# Patient Record
Sex: Female | Born: 1975 | Race: White | Hispanic: No | Marital: Married | Smoking: Former smoker
Health system: Southern US, Community
[De-identification: ages and names within clinical notes are randomized; demographics above are authoritative.]

## PROBLEM LIST (undated history)

## (undated) DIAGNOSIS — K219 Gastro-esophageal reflux disease without esophagitis: Secondary | ICD-10-CM

## (undated) HISTORY — PX: WISDOM TOOTH EXTRACTION: SHX21

## (undated) HISTORY — DX: Gastro-esophageal reflux disease without esophagitis: K21.9

## (undated) HISTORY — PX: TUBAL LIGATION: SHX77

---

## 1998-03-14 HISTORY — PX: DILATION AND CURETTAGE OF UTERUS: SHX78

## 1998-06-18 ENCOUNTER — Other Ambulatory Visit: Admission: RE | Admit: 1998-06-18 | Discharge: 1998-06-18 | Payer: Self-pay | Admitting: Gynecology

## 1999-04-19 ENCOUNTER — Other Ambulatory Visit: Admission: RE | Admit: 1999-04-19 | Discharge: 1999-04-19 | Payer: Self-pay | Admitting: Obstetrics & Gynecology

## 1999-04-21 ENCOUNTER — Ambulatory Visit (HOSPITAL_COMMUNITY): Admission: RE | Admit: 1999-04-21 | Discharge: 1999-04-21 | Payer: Self-pay | Admitting: Obstetrics & Gynecology

## 2004-02-13 ENCOUNTER — Ambulatory Visit: Payer: Self-pay | Admitting: Family Medicine

## 2004-11-08 ENCOUNTER — Ambulatory Visit: Payer: Self-pay | Admitting: Family Medicine

## 2007-03-05 ENCOUNTER — Ambulatory Visit: Payer: Self-pay | Admitting: Unknown Physician Specialty

## 2007-03-13 ENCOUNTER — Ambulatory Visit: Payer: Self-pay | Admitting: Unknown Physician Specialty

## 2013-03-06 ENCOUNTER — Ambulatory Visit: Payer: Self-pay | Admitting: Family Medicine

## 2013-09-26 ENCOUNTER — Ambulatory Visit: Payer: Self-pay | Admitting: Gastroenterology

## 2013-12-20 ENCOUNTER — Ambulatory Visit: Payer: Self-pay | Admitting: Urgent Care

## 2014-01-09 ENCOUNTER — Ambulatory Visit: Payer: Self-pay | Admitting: Gastroenterology

## 2014-12-18 ENCOUNTER — Ambulatory Visit (INDEPENDENT_AMBULATORY_CARE_PROVIDER_SITE_OTHER): Payer: 59 | Admitting: Family Medicine

## 2014-12-18 ENCOUNTER — Encounter: Payer: Self-pay | Admitting: Family Medicine

## 2014-12-18 ENCOUNTER — Encounter (INDEPENDENT_AMBULATORY_CARE_PROVIDER_SITE_OTHER): Payer: Self-pay

## 2014-12-18 VITALS — BP 116/72 | HR 84 | Temp 98.1°F | Ht 66.0 in | Wt 281.5 lb

## 2014-12-18 DIAGNOSIS — H6983 Other specified disorders of Eustachian tube, bilateral: Secondary | ICD-10-CM

## 2014-12-18 DIAGNOSIS — K219 Gastro-esophageal reflux disease without esophagitis: Secondary | ICD-10-CM | POA: Diagnosis not present

## 2014-12-18 DIAGNOSIS — Z01419 Encounter for gynecological examination (general) (routine) without abnormal findings: Secondary | ICD-10-CM | POA: Insufficient documentation

## 2014-12-18 DIAGNOSIS — H6980 Other specified disorders of Eustachian tube, unspecified ear: Secondary | ICD-10-CM | POA: Insufficient documentation

## 2014-12-18 NOTE — Patient Instructions (Signed)
Great to meet you. We will call you with your results and you can view them online.  Try OTC zyrtec or claritin daily for 2 weeks. Keep me updated.

## 2014-12-18 NOTE — Assessment & Plan Note (Signed)
Pap smear done today

## 2014-12-18 NOTE — Progress Notes (Signed)
Pre visit review using our clinic review tool, if applicable. No additional management support is needed unless otherwise documented below in the visit note. 

## 2014-12-18 NOTE — Addendum Note (Signed)
Addended by: Dianne Dun on: 12/18/2014 11:47 AM   Modules accepted: Kipp Brood

## 2014-12-18 NOTE — Progress Notes (Signed)
Subjective:   Patient ID: Tammie Murphy, female    DOB: 1975-07-22, 39 y.o.   MRN: [id number]  Tammie Murphy is a pleasant 39 y.o. year old female who presents to clinic today with Establish Care and Ear Pain  on 12/18/2014  HPI:   G3P1 here to establish care and for annual GYN exam.  Has had some bilateral ear pressure and itchy eyes on and off for weeks. No cough, fevers, chills or SOB. Has not taken anything for it. Has never been diagnosed with seasonal allergies.  Mammogram 03/13/14.  Had a pap smear in 02/2014 but was told she needed it done again- "too much gel on the slide."  No h/o abnormal pap smears.  Sexually active with husband only.  Remote h/o BTL.  No current outpatient prescriptions on file prior to visit.   No current facility-administered medications on file prior to visit.    Allergies  Allergen Reactions  . Flonase [Fluticasone Propionate] Anaphylaxis    Past Medical History  Diagnosis Date  . GERD (gastroesophageal reflux disease)     Past Surgical History  Procedure Laterality Date  . Tubal ligation    . Wisdom tooth extraction      Family History  Problem Relation Age of Onset  . Hypertension Mother   . Alcohol abuse Father   . AAA (abdominal aortic aneurysm) Father   . Hypertension Father   . AAA (abdominal aortic aneurysm) Maternal Grandmother   . Hypertension Maternal Grandfather   . Heart attack Maternal Grandfather   . Heart disease Maternal Grandfather   . AAA (abdominal aortic aneurysm) Paternal Grandmother   . Alzheimer's disease Paternal Grandmother   . Prostate cancer Paternal Grandfather   . Heart disease Paternal Grandfather     Social History   Social History  . Marital Status: Married    Spouse Name: N/A  . Number of Children: N/A  . Years of Education: N/A   Occupational History  . Not on file.   Social History Main Topics  . Smoking status: Former Games developer  . Smokeless tobacco: Never Used  . Alcohol  Use: No  . Drug Use: No  . Sexual Activity: Yes   Other Topics Concern  . Not on file   Social History Narrative  . No narrative on file   The PMH, PSH, Social History, Family History, Medications, and allergies have been reviewed in [facility], and have been updated if relevant.     Review of Systems  Constitutional: Negative.   HENT: Positive for congestion and rhinorrhea. Negative for sinus pressure.   Respiratory: Negative.   Cardiovascular: Negative.   Gastrointestinal: Negative.   Endocrine: Negative.   Genitourinary: Negative.   Skin: Negative.   Allergic/Immunologic: Negative.   Neurological: Negative.   Hematological: Negative.   Psychiatric/Behavioral: Negative.   All other systems reviewed and are negative.      Objective:    BP 116/72 mmHg  Pulse 84  Temp(Src) 98.1 F (36.7 C) (Oral)  Ht  (1.676 m)  Wt 281 lb 8 oz (127.688 kg)  BMI 45.46 kg/m2  SpO2 98%  LMP 12/08/2014   Physical Exam    General:  Well-developed,well-nourished,in no acute distress; alert,appropriate and cooperative throughout examination Head:  normocephalic and atraumatic.   Eyes:  vision grossly intact, pupils equal, pupils round, and pupils reactive to light.   Ears:  R ear normal and L ear normal.  Tms retracted bilaterally  Nose:  no external deformity.  Mouth:  good dentition.   Neck:  No deformities, masses, or tenderness noted. Breasts:  No mass, nodules, thickening, tenderness, bulging, retraction, inflamation, nipple discharge or skin changes noted.   Lungs:  Normal respiratory effort, chest expands symmetrically. Lungs are clear to auscultation, no crackles or wheezes. Heart:  Normal rate and regular rhythm. S1 and S2 normal without gallop, murmur, click, rub or other extra sounds. Abdomen:  Bowel sounds positive,abdomen soft and non-tender without masses, organomegaly or hernias noted. Rectal:  no external abnormalities.   Genitalia:  Pelvic Exam:        External:  normal female genitalia without lesions or masses        Vagina: normal without lesions or masses        Cervix: normal without lesions or masses        Adnexa: normal bimanual exam without masses or fullness        Uterus: normal by palpation        Pap smear: performed Msk:  No deformity or scoliosis noted of thoracic or lumbar spine.   Extremities:  No clubbing, cyanosis, edema, or deformity noted with normal full range of motion of all joints.   Neurologic:  alert & oriented X3 and gait normal.   Skin:  Intact without suspicious lesions or rashes Cervical Nodes:  No lymphadenopathy noted Axillary Nodes:  No palpable lymphadenopathy Psych:  Cognition and judgment appear intact. Alert and cooperative with normal attention span and concentration. No apparent delusions, illusions, hallucinations      Assessment & Plan:   Gastroesophageal reflux disease, esophagitis presence not specified No Follow-up on file.

## 2014-12-18 NOTE — Assessment & Plan Note (Signed)
Well controlled with prn PPI. No changes made today.

## 2014-12-18 NOTE — Assessment & Plan Note (Signed)
Advised supportive care with OTC oral anithistamines and steroid nasal sprays. No signs or symptoms of sinus infection at this point. Call or return to clinic prn if these symptoms worsen or fail to improve as anticipated. The patient indicates understanding of these issues and agrees with the plan.

## 2014-12-19 LAB — CBC WITH DIFFERENTIAL/PLATELET
BASOS ABS: 0 10*3/uL (ref 0.0–0.2)
Basos: 1 %
EOS (ABSOLUTE): 0.1 10*3/uL (ref 0.0–0.4)
Eos: 1 %
Hematocrit: 33.7 % — ABNORMAL LOW (ref 34.0–46.6)
Hemoglobin: 10.8 g/dL — ABNORMAL LOW (ref 11.1–15.9)
Immature Grans (Abs): 0 10*3/uL (ref 0.0–0.1)
Immature Granulocytes: 0 %
LYMPHS ABS: 2.5 10*3/uL (ref 0.7–3.1)
Lymphs: 30 %
MCH: 25.5 pg — ABNORMAL LOW (ref 26.6–33.0)
MCHC: 32 g/dL (ref 31.5–35.7)
MCV: 80 fL (ref 79–97)
MONOS ABS: 0.5 10*3/uL (ref 0.1–0.9)
Monocytes: 6 %
NEUTROS PCT: 62 %
Neutrophils Absolute: 5.3 10*3/uL (ref 1.4–7.0)
PLATELETS: 403 10*3/uL — AB (ref 150–379)
RBC: 4.23 x10E6/uL (ref 3.77–5.28)
RDW: 14.8 % (ref 12.3–15.4)
WBC: 8.4 10*3/uL (ref 3.4–10.8)

## 2014-12-19 LAB — COMPREHENSIVE METABOLIC PANEL
ALK PHOS: 98 IU/L (ref 39–117)
ALT: 15 IU/L (ref 0–32)
AST: 11 IU/L (ref 0–40)
Albumin/Globulin Ratio: 1.6 (ref 1.1–2.5)
Albumin: 4 g/dL (ref 3.5–5.5)
BUN/Creatinine Ratio: 11 (ref 9–23)
BUN: 9 mg/dL (ref 6–24)
Bilirubin Total: 0.2 mg/dL (ref 0.0–1.2)
CO2: 24 mmol/L (ref 18–29)
CREATININE: 0.83 mg/dL (ref 0.57–1.00)
Calcium: 8.8 mg/dL (ref 8.7–10.2)
Chloride: 100 mmol/L (ref 97–108)
GFR calc Af Amer: 102 mL/min/{1.73_m2} (ref >59)
GFR calc non Af Amer: 88 mL/min/{1.73_m2} (ref >59)
GLUCOSE: 87 mg/dL (ref 65–99)
Globulin, Total: 2.5 g/dL (ref 1.5–4.5)
Potassium: 4.6 mmol/L (ref 3.5–5.2)
SODIUM: 139 mmol/L (ref 134–144)
Total Protein: 6.5 g/dL (ref 6.0–8.5)

## 2014-12-19 LAB — LIPID PANEL
CHOLESTEROL TOTAL: 203 mg/dL — AB (ref 100–199)
Chol/HDL Ratio: 4.3 ratio units (ref 0.0–4.4)
HDL: 47 mg/dL (ref >39)
LDL CALC: 135 mg/dL — AB (ref 0–99)
TRIGLYCERIDES: 106 mg/dL (ref 0–149)
VLDL CHOLESTEROL CAL: 21 mg/dL (ref 5–40)

## 2014-12-19 LAB — TSH: TSH: 1.04 u[IU]/mL (ref 0.450–4.500)

## 2014-12-22 ENCOUNTER — Encounter: Payer: Self-pay | Admitting: Family Medicine

## 2014-12-23 LAB — PAP LB (LIQUID-BASED): PAP SMEAR COMMENT: 0

## 2014-12-24 ENCOUNTER — Encounter: Payer: Self-pay | Admitting: *Deleted

## 2015-01-28 ENCOUNTER — Ambulatory Visit (INDEPENDENT_AMBULATORY_CARE_PROVIDER_SITE_OTHER): Payer: 59 | Admitting: Family Medicine

## 2015-01-28 ENCOUNTER — Encounter: Payer: Self-pay | Admitting: Family Medicine

## 2015-01-28 VITALS — BP 126/82 | HR 87 | Temp 98.0°F | Wt 280.0 lb

## 2015-01-28 DIAGNOSIS — K219 Gastro-esophageal reflux disease without esophagitis: Secondary | ICD-10-CM | POA: Diagnosis not present

## 2015-01-28 DIAGNOSIS — F411 Generalized anxiety disorder: Secondary | ICD-10-CM

## 2015-01-28 MED ORDER — ALPRAZOLAM 0.25 MG PO TABS: 0.2500 mg | ORAL_TABLET | Freq: Two times a day (BID) | ORAL | Status: DC | PRN

## 2015-01-28 NOTE — Patient Instructions (Signed)
  Try over the counter gas-x (four times a day when necessary) or beano for bloating.    Trial of Align daily as a probiotic.  Please call me in 2 weeks- if no improvement, we will change your protonix like we discussed-  Option 1- a trial of prilosec or nexium instead of protonix Option 2- a trial of zantac or pepcid OTC with or without one of the above options.

## 2015-01-28 NOTE — Assessment & Plan Note (Signed)
Discussed panic disorder with pt. She is not willing to take SSRI daily. Rx given for xanax to take for severe panic attacks only.  Discussed sedation and addiction potential.

## 2015-01-28 NOTE — Assessment & Plan Note (Signed)
>  25 minutes spent in face to face time with patient, >50% spent in counselling or coordination of care discussing GERD and anxiety. I suggested that we add a probiotic and gas x or beno as needed for gas pain. Follow up in 2 weeks, then consider d/c protonix and transitioning to either a weaker PPI and or an H2 blocker. The patient indicates understanding of these issues and agrees with the plan.

## 2015-01-28 NOTE — Progress Notes (Signed)
Pre visit review using our clinic review tool, if applicable. No additional management support is needed unless otherwise documented below in the visit note. 

## 2015-01-28 NOTE — Progress Notes (Signed)
Subjective:   Patient ID: Tammie Murphy, female    DOB: [birth year], 39 y.o.   MRN: [id number]  Tammie Murphy is a pleasant 39 y.o. year old female who presents to clinic today with Follow-up; Gas; and Anxiety  on 01/28/2015  HPI:  Established care with me last month.  GERD- has been on protonix for almost a year after she had an endoscopy done.  It has been working well for symptoms of GERD. Over past few weeks, increased belching and bloating. No longer drinking sodas and spicy foods. Has not tried gas x.  No black or bloody stool. No nausea or vomiting.  Anxiety- long term issue.  Used to have daily panic attacks, now only a few times a month.  Usually when she gets worried about something and gets "carried away." No depression.  Tried lexapro years ago and made her feel like she "would crawl out of her skin."  Current Outpatient Prescriptions on File Prior to Visit  Medication Sig Dispense Refill  . pantoprazole (PROTONIX) 40 MG tablet Take 40 mg by mouth daily.     No current facility-administered medications on file prior to visit.    Allergies  Allergen Reactions  . Flonase [Fluticasone Propionate] Anaphylaxis    Past Medical History  Diagnosis Date  . GERD (gastroesophageal reflux disease)     Past Surgical History  Procedure Laterality Date  . Tubal ligation    . Wisdom tooth extraction      Family History  Problem Relation Age of Onset  . Hypertension Mother   . Alcohol abuse Father   . AAA (abdominal aortic aneurysm) Father   . Hypertension Father   . AAA (abdominal aortic aneurysm) Maternal Grandmother   . Hypertension Maternal Grandfather   . Heart attack Maternal Grandfather   . Heart disease Maternal Grandfather   . AAA (abdominal aortic aneurysm) Paternal Grandmother   . Alzheimer's disease Paternal Grandmother   . Prostate cancer Paternal Grandfather   . Heart disease Paternal Grandfather     Social History   Social History  .  Marital Status: Married    Spouse Name: N/A  . Number of Children: N/A  . Years of Education: N/A   Occupational History  . Not on file.   Social History Main Topics  . Smoking status: Former Games developer  . Smokeless tobacco: Never Used  . Alcohol Use: No  . Drug Use: No  . Sexual Activity: Yes   Other Topics Concern  . Not on file   Social History Narrative   The PMH, PSH, Social History, Family History, Medications, and allergies have been reviewed in [facility], and have been updated if relevant.   Review of Systems  Gastrointestinal: Positive for abdominal distention. Negative for nausea, vomiting, abdominal pain, diarrhea, constipation, blood in stool, anal bleeding and rectal pain.  Psychiatric/Behavioral: Negative for hallucinations, behavioral problems, confusion, sleep disturbance, self-injury, dysphoric mood and agitation. The patient is nervous/anxious. The patient is not hyperactive.   All other systems reviewed and are negative.      Objective:    BP 126/82 mmHg  Pulse 87  Temp(Src) 98 F (36.7 C) (Oral)  Wt 280 lb (127.007 kg)  SpO2 98%  LMP 01/01/2015 (Approximate)   Physical Exam  Constitutional: She appears well-developed and well-nourished. No distress.  HENT:  Head: Normocephalic.  Eyes: Conjunctivae are normal.  Cardiovascular: Normal rate.   Pulmonary/Chest: Effort normal.  Abdominal: Soft. She exhibits no distension.  Neurological: She is alert. No  cranial nerve deficit.  Skin: Skin is warm and dry.  Psychiatric: She has a normal mood and affect. Her behavior is normal. Judgment and thought content normal.  Nursing note and vitals reviewed.         Assessment & Plan:   Gastroesophageal reflux disease, esophagitis presence not specified  Generalized anxiety disorder No Follow-up on file.

## 2015-01-30 ENCOUNTER — Other Ambulatory Visit: Payer: Self-pay | Admitting: Gastroenterology

## 2015-03-06 ENCOUNTER — Ambulatory Visit (INDEPENDENT_AMBULATORY_CARE_PROVIDER_SITE_OTHER): Payer: 59 | Admitting: Family Medicine

## 2015-03-06 ENCOUNTER — Encounter: Payer: Self-pay | Admitting: Family Medicine

## 2015-03-06 VITALS — BP 114/72 | HR 80 | Temp 98.1°F | Wt 277.0 lb

## 2015-03-06 DIAGNOSIS — R1032 Left lower quadrant pain: Secondary | ICD-10-CM

## 2015-03-06 DIAGNOSIS — R1031 Right lower quadrant pain: Secondary | ICD-10-CM | POA: Diagnosis not present

## 2015-03-06 LAB — POCT URINALYSIS DIPSTICK
Bilirubin, UA: NEGATIVE
Blood, UA: NEGATIVE
Glucose, UA: NEGATIVE
Ketones, UA: NEGATIVE
LEUKOCYTES UA: NEGATIVE
NITRITE UA: NEGATIVE
PH UA: 6.5
Protein, UA: NEGATIVE
Spec Grav, UA: 1.015
Urobilinogen, UA: NEGATIVE

## 2015-03-06 LAB — POCT URINE PREGNANCY: PREG TEST UR: NEGATIVE

## 2015-03-06 NOTE — Progress Notes (Signed)
   Subjective:    Patient ID: Tammie Murphy, female    DOB: 23-Oct-1975, 39 y.o.   MRN: [id number]  HPI 39 year old female pt of Dr. Elmer SowAron's  presents with new onset pain in left lower quadrant, intermittently. Began after menses started ( heavy flow this time,usually very crampy as well with it). Now in last few days it has happened bilaterally. Pain on 4/10 pain scale. Occ pain shooting in tops of legs. Worse with sitting.  No dysuria. Slight mild urinary pressure.  NO D/C/ mild N/V. Nml BM today.  Also associated with low back pain bilaterally. She does have history of low back and hip pain.   She is sexually active, no new partners.  Social History /Family History/Past Medical History reviewed and updated if needed. S/P BTL.  Review of Systems  Constitutional: Negative for fever and fatigue.  HENT: Negative for ear pain.   Eyes: Negative for pain.  Respiratory: Negative for chest tightness and shortness of breath.   Cardiovascular: Negative for chest pain, palpitations and leg swelling.  Gastrointestinal: Negative for abdominal pain.  Genitourinary: Negative for dysuria.          Objective:   Physical Exam  Constitutional: Vital signs are normal. She appears well-developed and well-nourished. She is cooperative.  Non-toxic appearance. She does not appear ill. No distress.  Morbidly obese  HENT:  Head: Normocephalic.  Right Ear: Hearing, tympanic membrane, external ear and ear canal normal. Tympanic membrane is not erythematous, not retracted and not bulging.  Left Ear: Hearing, tympanic membrane, external ear and ear canal normal. Tympanic membrane is not erythematous, not retracted and not bulging.  Nose: No mucosal edema or rhinorrhea. Right sinus exhibits no maxillary sinus tenderness and no frontal sinus tenderness. Left sinus exhibits no maxillary sinus tenderness and no frontal sinus tenderness.  Mouth/Throat: Uvula is midline, oropharynx is clear and moist and  mucous membranes are normal.  Eyes: Conjunctivae, EOM and lids are normal. Pupils are equal, round, and reactive to light. Lids are everted and swept, no foreign bodies found.  Neck: Trachea normal and normal range of motion. Neck supple. Carotid bruit is not present. No thyroid mass and no thyromegaly present.  Cardiovascular: Normal rate, regular rhythm, S1 normal, S2 normal, normal heart sounds, intact distal pulses and normal pulses.  Exam reveals no gallop and no friction rub.   No murmur heard. Pulmonary/Chest: Effort normal and breath sounds normal. No tachypnea. No respiratory distress. She has no decreased breath sounds. She has no wheezes. She has no rhonchi. She has no rales.  Abdominal: Soft. Normal appearance and bowel sounds are normal. There is no hepatosplenomegaly. There is tenderness in the left lower quadrant. There is no rigidity, no guarding and no CVA tenderness.  Mild ttp  Neurological: She is alert.  Skin: Skin is warm, dry and intact. No rash noted.  Psychiatric: Her speech is normal and behavior is normal. Judgment and thought content normal. Her mood appears not anxious. Cognition and memory are normal. She does not exhibit a depressed mood.          Assessment & Plan:

## 2015-03-06 NOTE — Progress Notes (Signed)
Pre visit review using our clinic review tool, if applicable. No additional management support is needed unless otherwise documented below in the visit note. 

## 2015-03-06 NOTE — Patient Instructions (Signed)
Stop at front desk to set up ultrasound pelvic to eval ovaries etc.  Can use ibuprofen 800 mg every 8 hours for pain OTC.  If severe pain  go to ER.

## 2015-03-06 NOTE — Assessment & Plan Note (Signed)
Neg UA, neg Upreg. Will send for eval of ovaries with transvag and pelvic US.  NSAIDS for pain as needed.

## 2015-03-11 ENCOUNTER — Ambulatory Visit: Payer: 59

## 2015-03-11 ENCOUNTER — Ambulatory Visit
Admission: RE | Admit: 2015-03-11 | Discharge: 2015-03-11 | Disposition: A | Discharge status: Home / Self Care | Payer: 59 | Source: Ambulatory Visit | Attending: Family Medicine | Admitting: Family Medicine

## 2015-03-11 DIAGNOSIS — R1032 Left lower quadrant pain: Secondary | ICD-10-CM | POA: Insufficient documentation

## 2015-03-11 DIAGNOSIS — R1031 Right lower quadrant pain: Secondary | ICD-10-CM | POA: Diagnosis present

## 2015-03-11 DIAGNOSIS — N858 Other specified noninflammatory disorders of uterus: Secondary | ICD-10-CM | POA: Insufficient documentation

## 2015-06-01 ENCOUNTER — Ambulatory Visit (INDEPENDENT_AMBULATORY_CARE_PROVIDER_SITE_OTHER): Payer: 59 | Admitting: Family Medicine

## 2015-06-01 ENCOUNTER — Encounter: Payer: Self-pay | Admitting: Family Medicine

## 2015-06-01 VITALS — BP 126/84 | HR 79 | Temp 97.9°F | Wt 282.8 lb

## 2015-06-01 DIAGNOSIS — M5489 Other dorsalgia: Secondary | ICD-10-CM | POA: Diagnosis not present

## 2015-06-01 DIAGNOSIS — R0789 Other chest pain: Secondary | ICD-10-CM | POA: Diagnosis not present

## 2015-06-01 DIAGNOSIS — M549 Dorsalgia, unspecified: Secondary | ICD-10-CM | POA: Insufficient documentation

## 2015-06-01 DIAGNOSIS — R079 Chest pain, unspecified: Secondary | ICD-10-CM | POA: Insufficient documentation

## 2015-06-01 MED ORDER — CYCLOBENZAPRINE HCL 5 MG PO TABS: 5.0000 mg | ORAL_TABLET | Freq: Three times a day (TID) | ORAL | Status: DC | PRN

## 2015-06-01 NOTE — Progress Notes (Signed)
Pre visit review using our clinic review tool, if applicable. No additional management support is needed unless otherwise documented below in the visit note. 

## 2015-06-01 NOTE — Assessment & Plan Note (Signed)
Consistent with spasm. eRx sent for flexeril prn. Discussed PT if symptoms persist. The patient indicates understanding of these issues and agrees with the plan.

## 2015-06-01 NOTE — Progress Notes (Signed)
Subjective:   Patient ID: Tammie Murphy, female    DOB: 1975/08/18, 40 y.o.   MRN: [id number]  Tammie Murphy is a pleasant 40 y.o. year old female who presents to clinic today with Back Pain and Chest Pain  on 06/01/2015  HPI:  Intermittent left shoulder pain, sometimes radiates to her back.  Previous PCP would give her valium for a few days and symptoms would resolve.  Now radiates a little to her left chest which makes her feel very anxious.  Denies SOB.  This typically occurs at rest or with movement of her left shoulder but not with exertion.  Does have FH of CAD.  No recent injury.  Trying to cut back on NSAIDs due to GI side effects.   Current Outpatient Prescriptions on File Prior to Visit  Medication Sig Dispense Refill  . ALPRAZolam (XANAX) 0.25 MG tablet Take 1 tablet (0.25 mg total) by mouth 2 (two) times daily as needed for anxiety. 30 tablet 0  . pantoprazole (PROTONIX) 40 MG tablet Take 1 tablet by mouth  daily 90 tablet 3   No current facility-administered medications on file prior to visit.    Allergies  Allergen Reactions  . Flonase [Fluticasone Propionate] Anaphylaxis    Past Medical History  Diagnosis Date  . GERD (gastroesophageal reflux disease)     Past Surgical History  Procedure Laterality Date  . Tubal ligation    . Wisdom tooth extraction      Family History  Problem Relation Age of Onset  . Hypertension Mother   . Alcohol abuse Father   . AAA (abdominal aortic aneurysm) Father   . Hypertension Father   . AAA (abdominal aortic aneurysm) Maternal Grandmother   . Hypertension Maternal Grandfather   . Heart attack Maternal Grandfather   . Heart disease Maternal Grandfather   . AAA (abdominal aortic aneurysm) Paternal Grandmother   . Alzheimer's disease Paternal Grandmother   . Prostate cancer Paternal Grandfather   . Heart disease Paternal Grandfather     Social History   Social History  . Marital Status: Married   Spouse Name: N/A  . Number of Children: N/A  . Years of Education: N/A   Occupational History  . Not on file.   Social History Main Topics  . Smoking status: Former Games developer  . Smokeless tobacco: Never Used  . Alcohol Use: No  . Drug Use: No  . Sexual Activity: Yes   Other Topics Concern  . Not on file   Social History Narrative   The PMH, PSH, Social History, Family History, Medications, and allergies have been reviewed in [facility], and have been updated if relevant.   Review of Systems  Constitutional: Negative.   HENT: Negative.   Respiratory: Positive for chest tightness. Negative for apnea, cough, choking, shortness of breath, wheezing and stridor.   Cardiovascular: Positive for chest pain and palpitations. Negative for leg swelling.  Gastrointestinal: Negative.   Genitourinary: Negative.   Musculoskeletal:       + left shoulder pain that radiates to left thoracic area  Skin: Negative.   Neurological: Negative.   Psychiatric/Behavioral: The patient is nervous/anxious.   All other systems reviewed and are negative.      Objective:    BP 126/84 mmHg  Pulse 79  Temp(Src) 97.9 F (36.6 C) (Oral)  Wt 282 lb 12 oz (128.255 kg)  SpO2 99%  LMP 05/18/2015 (Approximate)   Physical Exam  Constitutional: She is oriented to person, place, and  time. She appears well-developed and well-nourished. No distress.  HENT:  Head: Normocephalic.  Eyes: Conjunctivae are normal.  Cardiovascular: Normal rate, regular rhythm and normal heart sounds.   Pulmonary/Chest: Effort normal.  Musculoskeletal:       Left shoulder: She exhibits tenderness, pain and spasm. She exhibits normal range of motion, no bony tenderness, no swelling, no effusion, no crepitus, no deformity, no laceration, normal pulse and normal strength.  Neurological: She is alert and oriented to person, place, and time. No cranial nerve deficit.  Skin: Skin is warm and dry. She is not diaphoretic.  Psychiatric: She has a  normal mood and affect. Her behavior is normal. Judgment and thought content normal.  Nursing note and vitals reviewed.         Assessment & Plan:   Other chest pain  Other back pain No Follow-up on file.

## 2015-06-01 NOTE — Assessment & Plan Note (Signed)
Intermittent, unlikely cardiac. ? Related to pinch nerve from shoulder and possibly anxiety. EKG NSR. If symptoms worsen, refer to cardiology for work up/ ? Stress test/holter monitor.

## 2015-12-03 ENCOUNTER — Encounter: Payer: Self-pay | Admitting: Family Medicine

## 2015-12-03 ENCOUNTER — Ambulatory Visit (INDEPENDENT_AMBULATORY_CARE_PROVIDER_SITE_OTHER): Payer: 59 | Admitting: Family Medicine

## 2015-12-03 VITALS — BP 132/92 | HR 81 | Temp 98.4°F | Wt 283.8 lb

## 2015-12-03 DIAGNOSIS — J029 Acute pharyngitis, unspecified: Secondary | ICD-10-CM

## 2015-12-03 LAB — POCT RAPID STREP A (OFFICE): Rapid Strep A Screen: POSITIVE — AB

## 2015-12-03 MED ORDER — AMOXICILLIN 500 MG PO CAPS: 500.0000 mg | ORAL_CAPSULE | Freq: Two times a day (BID) | ORAL | 0 refills | Status: DC

## 2015-12-03 NOTE — Patient Instructions (Signed)

## 2015-12-03 NOTE — Progress Notes (Signed)
Pre visit review using our clinic review tool, if applicable. No additional management support is needed unless otherwise documented below in the visit note. 

## 2015-12-03 NOTE — Progress Notes (Signed)
SUBJECTIVE: 40 y.o. female with sore throat, myalgias, swollen glands, headache and fever for 2 days. No history of rheumatic fever. Other symptoms: congestion.  Current Outpatient Prescriptions on File Prior to Visit  Medication Sig Dispense Refill  . ALPRAZolam (XANAX) 0.25 MG tablet Take 1 tablet (0.25 mg total) by mouth 2 (two) times daily as needed for anxiety. 30 tablet 0  . cyclobenzaprine (FLEXERIL) 5 MG tablet Take 1 tablet (5 mg total) by mouth 3 (three) times daily as needed for muscle spasms. 30 tablet 1  . pantoprazole (PROTONIX) 40 MG tablet Take 1 tablet by mouth  daily 90 tablet 3  . polyethylene glycol (MIRALAX / GLYCOLAX) packet Take 17 g by mouth daily.     No current facility-administered medications on file prior to visit.     Allergies  Allergen Reactions  . Flonase [Fluticasone Propionate] Anaphylaxis    Past Medical History:  Diagnosis Date  . GERD (gastroesophageal reflux disease)     Past Surgical History:  Procedure Laterality Date  . TUBAL LIGATION    . WISDOM TOOTH EXTRACTION      Family History  Problem Relation Age of Onset  . Hypertension Mother   . Alcohol abuse Father   . AAA (abdominal aortic aneurysm) Father   . Hypertension Father   . AAA (abdominal aortic aneurysm) Maternal Grandmother   . Hypertension Maternal Grandfather   . Heart attack Maternal Grandfather   . Heart disease Maternal Grandfather   . AAA (abdominal aortic aneurysm) Paternal Grandmother   . Alzheimer's disease Paternal Grandmother   . Prostate cancer Paternal Grandfather   . Heart disease Paternal Grandfather     Social History   Social History  . Marital status: Married    Spouse name: N/A  . Number of children: N/A  . Years of education: N/A   Occupational History  . Not on file.   Social History Main Topics  . Smoking status: Former Games developermoker  . Smokeless tobacco: Never Used  . Alcohol use No  . Drug use: No  . Sexual activity: Yes   Other Topics  Concern  . Not on file   Social History Narrative  . No narrative on file   The PMH, PSH, Social History, Family History, Medications, and [facility], and have been updated if relevant.  OBJECTIVE:  BP (!) 132/92   Pulse 81   Temp 98.4 F (36.9 C) (Oral)   Wt 283 lb 12 oz (128.7 kg)   LMP 11/26/2015 (Approximate)   SpO2 98%   BMI 45.80 kg/m   Vitals as noted above. Appears alert, well appearing, and in no distress. Ears: bilateral TM's and external ear canals normal Oropharynx: erythematous and tonsils hypertrophied with exudate Neck: supple, no significant adenopathy Lungs: clear to auscultation, no wheezes, rales or rhonchi, symmetric air entry Rapid Strep test is positive  ASSESSMENT: Streptococcal pharyngitis  PLAN: Per orders. Gargle, use acetaminophen or other OTC analgesic, and take Rx fully as prescribed. Call if other family members develop similar symptoms. See prn.

## 2015-12-04 ENCOUNTER — Encounter: Payer: Self-pay | Admitting: Family Medicine

## 2015-12-10 ENCOUNTER — Ambulatory Visit (INDEPENDENT_AMBULATORY_CARE_PROVIDER_SITE_OTHER): Payer: 59 | Admitting: Family Medicine

## 2015-12-10 ENCOUNTER — Encounter: Payer: Self-pay | Admitting: Family Medicine

## 2015-12-10 VITALS — BP 142/88 | HR 89 | Temp 98.3°F | Wt 283.4 lb

## 2015-12-10 DIAGNOSIS — J069 Acute upper respiratory infection, unspecified: Secondary | ICD-10-CM | POA: Diagnosis not present

## 2015-12-10 DIAGNOSIS — B9789 Other viral agents as the cause of diseases classified elsewhere: Principal | ICD-10-CM

## 2015-12-10 MED ORDER — BENZONATATE 100 MG PO CAPS: 100.0000 mg | ORAL_CAPSULE | Freq: Three times a day (TID) | ORAL | 0 refills | Status: DC | PRN

## 2015-12-10 NOTE — Patient Instructions (Signed)
  For muscle aches, headache, sore throat you can take over the counter acetaminophen or ibuprofen as directed on the package For nasal congestion you can use Afrin nasal spray twice a day for up to 3 days, and /or sudafed, and/or saline nasal spray Please come back to see us or go to the emergency department if you are not better in 5 to 7 days or if you develop fever over 101 for more than 48 hours or if you develop wheezing or shortness of breath.

## 2015-12-10 NOTE — Progress Notes (Signed)
Subjective:    Patient ID: Barrie DunkerJohanna C Weathington, female    DOB: 1975-06-21, 40 y.o.   MRN: [id number]  HPI This is a 40 yo who presents with cough and congestion x 5-6 days. She was seen 12/03/15 (7 days ago) and was diagnosed with strep throat. She was prescribed amoxicillin, she has two days remaining. She started to get better then started coughing and having chest congestion. She had fever5 days ago, hoarse voice and muscle aches. She stopped coughing last night and feels tight in her neck. She has had nasal congestion which has improved. She has taken some mucinex fast max. She is concerned about being contagious. Slept well last night. No wheeze or SOB. No history of asthma or seasonal allergies.   Past Medical History:  Diagnosis Date  . GERD (gastroesophageal reflux disease)    Past Surgical History:  Procedure Laterality Date  . TUBAL LIGATION    . WISDOM TOOTH EXTRACTION     Family History  Problem Relation Age of Onset  . Hypertension Mother   . Alcohol abuse Father   . AAA (abdominal aortic aneurysm) Father   . Hypertension Father   . AAA (abdominal aortic aneurysm) Maternal Grandmother   . Hypertension Maternal Grandfather   . Heart attack Maternal Grandfather   . Heart disease Maternal Grandfather   . AAA (abdominal aortic aneurysm) Paternal Grandmother   . Alzheimer's disease Paternal Grandmother   . Prostate cancer Paternal Grandfather   . Heart disease Paternal Grandfather    Social History  Substance Use Topics  . Smoking status: Former Games developermoker  . Smokeless tobacco: Never Used  . Alcohol use No      Review of Systems Per HPI    Objective:   Physical Exam  Constitutional: She is oriented to person, place, and time. She appears well-developed and well-nourished. No distress.  Obese.   HENT:  Head: Normocephalic and atraumatic.  Right Ear: Tympanic membrane, external ear and ear canal normal.  Left Ear: Tympanic membrane, external ear and ear canal normal.   Nose: Rhinorrhea present.  Mouth/Throat: Uvula is midline, oropharynx is clear and moist and mucous membranes are normal. No oropharyngeal exudate.  Cardiovascular: Normal rate, regular rhythm and normal heart sounds.   Pulmonary/Chest: Effort normal and breath sounds normal.  Occasional dry cough.   Neurological: She is alert and oriented to person, place, and time.  Skin: Skin is warm and dry. She is not diaphoretic.  Psychiatric: She has a normal mood and affect. Her behavior is normal. Judgment and thought content normal.  Vitals reviewed.     BP (!) 142/88   Pulse 89   Temp 98.3 F (36.8 C)   Wt 283 lb 6.4 oz (128.5 kg)   LMP 11/26/2015 (Approximate)   SpO2 96%   BMI 45.74 kg/m  Wt Readings from Last 3 Encounters:  12/10/15 283 lb 6.4 oz (128.5 kg)  12/03/15 283 lb 12 oz (128.7 kg)  06/01/15 282 lb 12 oz (128.3 kg)       Assessment & Plan:  1. Viral URI with cough - suspect she has viral illness unrelated to strep pharyngitis - discussed with patient and provided written and verbal instructions for symptomatic treatment as well as RTC precautions - benzonatate (TESSALON) 100 MG capsule; Take 1-2 capsules (100-200 mg total) by mouth 3 (three) times daily as needed for cough.  Dispense: 40 capsule; Refill: 0   Olean Reeeborah Gessner, FNP-BC  [facility], [facility]  12/10/2015 4:16 PM

## 2015-12-11 ENCOUNTER — Other Ambulatory Visit: Payer: Self-pay | Admitting: Family Medicine

## 2015-12-11 DIAGNOSIS — Z01419 Encounter for gynecological examination (general) (routine) without abnormal findings: Secondary | ICD-10-CM

## 2015-12-18 ENCOUNTER — Other Ambulatory Visit (INDEPENDENT_AMBULATORY_CARE_PROVIDER_SITE_OTHER): Payer: 59

## 2015-12-18 DIAGNOSIS — Z01419 Encounter for gynecological examination (general) (routine) without abnormal findings: Secondary | ICD-10-CM | POA: Diagnosis not present

## 2015-12-18 NOTE — Addendum Note (Signed)
Addended by: Alvina ChouWALSH, TERRI J on: 12/18/2015 08:17 AM   Modules accepted: Orders

## 2015-12-19 LAB — LIPID PANEL
CHOL/HDL RATIO: 3.7 ratio (ref 0.0–4.4)
Cholesterol, Total: 174 mg/dL (ref 100–199)
HDL: 47 mg/dL (ref >39)
LDL Calculated: 113 mg/dL — ABNORMAL HIGH (ref 0–99)
TRIGLYCERIDES: 72 mg/dL (ref 0–149)
VLDL Cholesterol Cal: 14 mg/dL (ref 5–40)

## 2015-12-19 LAB — TSH: TSH: 1.71 u[IU]/mL (ref 0.450–4.500)

## 2015-12-19 LAB — COMPREHENSIVE METABOLIC PANEL
ALBUMIN: 3.8 g/dL (ref 3.5–5.5)
ALK PHOS: 88 IU/L (ref 39–117)
ALT: 14 IU/L (ref 0–32)
AST: 10 IU/L (ref 0–40)
Albumin/Globulin Ratio: 1.4 (ref 1.2–2.2)
BILIRUBIN TOTAL: 0.3 mg/dL (ref 0.0–1.2)
BUN / CREAT RATIO: 14 (ref 9–23)
BUN: 12 mg/dL (ref 6–24)
CHLORIDE: 104 mmol/L (ref 96–106)
CO2: 23 mmol/L (ref 18–29)
CREATININE: 0.87 mg/dL (ref 0.57–1.00)
Calcium: 9.1 mg/dL (ref 8.7–10.2)
GFR calc non Af Amer: 83 mL/min/{1.73_m2} (ref >59)
GFR, EST AFRICAN AMERICAN: 96 mL/min/{1.73_m2} (ref >59)
GLUCOSE: 89 mg/dL (ref 65–99)
Globulin, Total: 2.8 g/dL (ref 1.5–4.5)
Potassium: 4.7 mmol/L (ref 3.5–5.2)
Sodium: 139 mmol/L (ref 134–144)
TOTAL PROTEIN: 6.6 g/dL (ref 6.0–8.5)

## 2015-12-19 LAB — CBC WITH DIFFERENTIAL/PLATELET
BASOS ABS: 0 10*3/uL (ref 0.0–0.2)
BASOS: 1 %
EOS (ABSOLUTE): 0.1 10*3/uL (ref 0.0–0.4)
Eos: 1 %
HEMOGLOBIN: 9.5 g/dL — AB (ref 11.1–15.9)
Hematocrit: 31.7 % — ABNORMAL LOW (ref 34.0–46.6)
IMMATURE GRANS (ABS): 0 10*3/uL (ref 0.0–0.1)
Immature Granulocytes: 0 %
LYMPHS ABS: 2.4 10*3/uL (ref 0.7–3.1)
Lymphs: 31 %
MCH: 22.6 pg — AB (ref 26.6–33.0)
MCHC: 30 g/dL — AB (ref 31.5–35.7)
MCV: 75 fL — ABNORMAL LOW (ref 79–97)
MONOS ABS: 0.5 10*3/uL (ref 0.1–0.9)
Monocytes: 6 %
NEUTROS ABS: 4.8 10*3/uL (ref 1.4–7.0)
NEUTROS PCT: 61 %
PLATELETS: 420 10*3/uL — AB (ref 150–379)
RBC: 4.21 x10E6/uL (ref 3.77–5.28)
RDW: 16.1 % — ABNORMAL HIGH (ref 12.3–15.4)
WBC: 7.9 10*3/uL (ref 3.4–10.8)

## 2015-12-23 ENCOUNTER — Ambulatory Visit (INDEPENDENT_AMBULATORY_CARE_PROVIDER_SITE_OTHER): Payer: 59 | Admitting: Family Medicine

## 2015-12-23 VITALS — BP 124/64 | HR 87 | Temp 97.9°F | Ht 65.75 in | Wt 283.5 lb

## 2015-12-23 DIAGNOSIS — Z01419 Encounter for gynecological examination (general) (routine) without abnormal findings: Secondary | ICD-10-CM

## 2015-12-23 DIAGNOSIS — D649 Anemia, unspecified: Secondary | ICD-10-CM | POA: Insufficient documentation

## 2015-12-23 DIAGNOSIS — D509 Iron deficiency anemia, unspecified: Secondary | ICD-10-CM

## 2015-12-23 MED ORDER — FERROUS SULFATE 325 (65 FE) MG PO TABS: 325.0000 mg | ORAL_TABLET | Freq: Every day | ORAL | 3 refills | Status: DC

## 2015-12-23 MED ORDER — PANTOPRAZOLE SODIUM 40 MG PO TBEC: 40.0000 mg | DELAYED_RELEASE_TABLET | Freq: Every day | ORAL | 3 refills | Status: DC

## 2015-12-23 NOTE — Progress Notes (Signed)
Pre visit review using our clinic review tool, if applicable. No additional management support is needed unless otherwise documented below in the visit note. 

## 2015-12-23 NOTE — Patient Instructions (Signed)
Great to see you. Happy birthday. We are starting ferrous sulfate (iron) daily with breakfast Please return in 2 months to repeat labs.   Please call the breast center to schedule your mammogram.

## 2015-12-23 NOTE — Assessment & Plan Note (Signed)
Reviewed preventive care protocols, scheduled due services, and updated immunizations Discussed nutrition, exercise, diet, and healthy lifestyle.  

## 2015-12-23 NOTE — Progress Notes (Signed)
Subjective:   Patient ID: Tammie Murphy, female    DOB: Sep 02, 1975, 40 y.o.   MRN: [id number]  Tammie DunkerJohanna C Millhouse is a pleasant 40 y.o. year old female who presents to clinic today with Annual Exam  on 12/23/2015  HPI:  G3P1 here for CPX.  Mammogram 03/13/14.  Neg pap smear 12/18/14 (done by me).   Sexually active with husband only.  Remote h/o BTL.  Anemia- microcytic.  H/o iron deficiency anemia.  Does have heavy periods.  Has been a little tired.  No DOE. Lab Results  Component Value Date   WBC 7.9 12/18/2015   HCT 31.7 (L) 12/18/2015   MCV 75 (L) 12/18/2015   PLT 420 (H) 12/18/2015     Lab Results  Component Value Date   CHOL 174 12/18/2015   HDL 47 12/18/2015   LDLCALC 113 (H) 12/18/2015   TRIG 72 12/18/2015   CHOLHDL 3.7 12/18/2015   Lab Results  Component Value Date   CREATININE 0.87 12/18/2015   Lab Results  Component Value Date   NA 139 12/18/2015   K 4.7 12/18/2015   CL 104 12/18/2015   CO2 23 12/18/2015   Lab Results  Component Value Date   ALT 14 12/18/2015   AST 10 12/18/2015   ALKPHOS 88 12/18/2015   BILITOT 0.3 12/18/2015   Lab Results  Component Value Date   TSH 1.710 12/18/2015    Current Outpatient Prescriptions on File Prior to Visit  Medication Sig Dispense Refill  . polyethylene glycol (MIRALAX / GLYCOLAX) packet Take 17 g by mouth daily.     No current facility-administered medications on file prior to visit.     Allergies  Allergen Reactions  . Flonase [Fluticasone Propionate] Anaphylaxis    Past Medical History:  Diagnosis Date  . GERD (gastroesophageal reflux disease)     Past Surgical History:  Procedure Laterality Date  . TUBAL LIGATION    . WISDOM TOOTH EXTRACTION      Family History  Problem Relation Age of Onset  . Hypertension Mother   . Alcohol abuse Father   . AAA (abdominal aortic aneurysm) Father   . Hypertension Father   . AAA (abdominal aortic aneurysm) Maternal Grandmother   . Hypertension  Maternal Grandfather   . Heart attack Maternal Grandfather   . Heart disease Maternal Grandfather   . AAA (abdominal aortic aneurysm) Paternal Grandmother   . Alzheimer's disease Paternal Grandmother   . Prostate cancer Paternal Grandfather   . Heart disease Paternal Grandfather     Social History   Social History  . Marital status: Married    Spouse name: N/A  . Number of children: N/A  . Years of education: N/A   Occupational History  . Not on file.   Social History Main Topics  . Smoking status: Former Games developermoker  . Smokeless tobacco: Never Used  . Alcohol use No  . Drug use: No  . Sexual activity: Yes   Other Topics Concern  . Not on file   Social History Narrative  . No narrative on file   The PMH, PSH, Social History, Family History, Medications, and allergies have been reviewed in [facility], and have been updated if relevant.     Review of Systems  Constitutional: Positive for fatigue.  HENT: Negative for congestion, rhinorrhea and sinus pressure.   Respiratory: Negative.   Cardiovascular: Negative.   Gastrointestinal: Negative.   Endocrine: Negative.   Genitourinary: Negative.   Skin: Negative.   Allergic/Immunologic: Negative.  Neurological: Negative.   Hematological: Negative.   Psychiatric/Behavioral: Negative.   All other systems reviewed and are negative.      Objective:    BP 124/64   Pulse 87   Temp 97.9 F (36.6 C) (Oral)   Ht 5' 5.75" (1.67 m)   Wt 283 lb 8 oz (128.6 kg)   LMP 11/26/2015 (Approximate)   SpO2 97%   BMI 46.11 kg/m    Physical Exam    General:  Well-developed,well-nourished,in no acute distress; alert,appropriate and cooperative throughout examination Head:  normocephalic and atraumatic.   Eyes:  vision grossly intact, pupils equal, pupils round, and pupils reactive to light.   Ears:  R ear normal and L ear normal.  Nose:  no external deformity.   Mouth:  good dentition.   Neck:  No deformities, masses, or tenderness  noted. Breasts:  No mass, nodules, thickening, tenderness, bulging, retraction, inflamation, nipple discharge or skin changes noted.  Lungs:  Normal respiratory effort, chest expands symmetrically. Lungs are clear to auscultation, no crackles or wheezes. Heart:  Normal rate and regular rhythm. S1 and S2 normal without gallop, murmur, click, rub or other extra sounds. Abdomen:  Bowel sounds positive,abdomen soft and non-tender without masses, organomegaly or hernias noted. Msk:  No deformity or scoliosis noted of thoracic or lumbar spine.   Extremities:  No clubbing, cyanosis, edema, or deformity noted with normal full range of motion of all joints.   Neurologic:  alert & oriented X3 and gait normal.   Skin:  Intact without suspicious lesions or rashes Cervical Nodes:  No lymphadenopathy noted Axillary Nodes:  No palpable lymphadenopathy Psych:  Cognition and judgment appear intact. Alert and cooperative with normal attention span and concentration. No apparent delusions, illusions, hallucinations      Assessment & Plan:   Well woman exam with routine gynecological exam  Encounter for gynecological examination without abnormal finding  Iron deficiency anemia, unspecified iron deficiency anemia type No Follow-up on file.

## 2015-12-23 NOTE — Assessment & Plan Note (Signed)
New- ferrous sulfate 325 mg daily. Follow up cbc, ferritin in 2 months. The patient indicates understanding of these issues and agrees with the plan.

## 2016-01-06 ENCOUNTER — Other Ambulatory Visit: Payer: Self-pay | Admitting: Family Medicine

## 2016-01-06 DIAGNOSIS — Z1231 Encounter for screening mammogram for malignant neoplasm of breast: Secondary | ICD-10-CM

## 2016-01-22 ENCOUNTER — Other Ambulatory Visit: Payer: Self-pay | Admitting: Family Medicine

## 2016-01-22 DIAGNOSIS — D509 Iron deficiency anemia, unspecified: Secondary | ICD-10-CM

## 2016-01-27 ENCOUNTER — Other Ambulatory Visit (INDEPENDENT_AMBULATORY_CARE_PROVIDER_SITE_OTHER): Payer: 59

## 2016-01-27 DIAGNOSIS — D509 Iron deficiency anemia, unspecified: Secondary | ICD-10-CM | POA: Diagnosis not present

## 2016-01-27 NOTE — Addendum Note (Signed)
Addended by: Baldomero LamyHAVERS, NATASHA C on: 01/27/2016 08:10 AM   Modules accepted: Orders

## 2016-01-28 LAB — CBC WITH DIFFERENTIAL/PLATELET
BASOS ABS: 0 10*3/uL (ref 0.0–0.2)
Basos: 1 %
EOS (ABSOLUTE): 0.1 10*3/uL (ref 0.0–0.4)
Eos: 1 %
HEMOGLOBIN: 10.4 g/dL — AB (ref 11.1–15.9)
Hematocrit: 33.6 % — ABNORMAL LOW (ref 34.0–46.6)
IMMATURE GRANS (ABS): 0 10*3/uL (ref 0.0–0.1)
IMMATURE GRANULOCYTES: 0 %
LYMPHS: 30 %
Lymphocytes Absolute: 2 10*3/uL (ref 0.7–3.1)
MCH: 23.3 pg — AB (ref 26.6–33.0)
MCHC: 31 g/dL — ABNORMAL LOW (ref 31.5–35.7)
MCV: 75 fL — ABNORMAL LOW (ref 79–97)
MONOCYTES: 5 %
Monocytes Absolute: 0.3 10*3/uL (ref 0.1–0.9)
NEUTROS ABS: 4.1 10*3/uL (ref 1.4–7.0)
NEUTROS PCT: 63 %
PLATELETS: 386 10*3/uL — AB (ref 150–379)
RBC: 4.46 x10E6/uL (ref 3.77–5.28)
RDW: 17.7 % — ABNORMAL HIGH (ref 12.3–15.4)
WBC: 6.5 10*3/uL (ref 3.4–10.8)

## 2016-01-28 LAB — FERRITIN: Ferritin: 16 ng/mL (ref 15–150)

## 2016-02-08 ENCOUNTER — Encounter: Payer: Self-pay | Admitting: Family Medicine

## 2016-02-11 ENCOUNTER — Ambulatory Visit
Admission: RE | Admit: 2016-02-11 | Discharge: 2016-02-11 | Disposition: A | Discharge status: Home / Self Care | Payer: 59 | Source: Ambulatory Visit | Attending: Family Medicine | Admitting: Family Medicine

## 2016-02-11 DIAGNOSIS — Z1231 Encounter for screening mammogram for malignant neoplasm of breast: Secondary | ICD-10-CM | POA: Insufficient documentation

## 2016-02-15 ENCOUNTER — Telehealth: Payer: 59 | Admitting: Family

## 2016-02-15 DIAGNOSIS — R51 Headache: Principal | ICD-10-CM

## 2016-02-15 DIAGNOSIS — R519 Headache, unspecified: Secondary | ICD-10-CM

## 2016-02-15 NOTE — Progress Notes (Signed)
Based on what you shared with me it looks like you have a serious condition that should be evaluated in a face to face office visit.  If you are having a true medical emergency please call 911.  If you need an urgent face to face visit, [facility] has four urgent care centers for your convenience.  If you need care fast and have a high deductible or no insurance consider:   [email or web address]  [phone]  [address], [address] [place], [place] [address] 8 am to 8 pm Monday-Friday 10 am to 4 pm Saturday-Sunday   The following sites will take your  insurance:    . [facility]  [phone] Get Driving Directions Find a Provider at this Location  [address] [place], [place] [address] . 10 am to 8 pm Monday-Friday . 12 pm to 8 pm Saturday-Sunday   . [facility]  [phone] Get Driving Directions Find a Provider at this Location  [address], [address] [place], [place] [address] . 8 am to 8 pm Monday-Friday . 9 am to 6 pm Saturday . 11 am to 6 pm Sunday   . [facility] [place]  [phone] Get Driving Directions  [address] [place], [place] [address] . 8 am to 8 pm Monday-Friday . 8 am to 4 pm Saturday-Sunday   . [facility] (walk-ins welcome, or call for a scheduled time)  [phone]  Get Driving Directions Find a Provider at this Location  [address] [place], [place] [address] . 8 am to 8:30 pm Monday-Thursday . 8 am to 6 pm Friday . 8 am to 4 pm Saturday-Sunday   Your e-visit answers were reviewed by a board certified advanced clinical practitioner to complete your personal care plan.  Thank you for using e-Visits.  

## 2016-02-16 ENCOUNTER — Encounter: Payer: Self-pay | Admitting: Family Medicine

## 2016-02-16 ENCOUNTER — Ambulatory Visit (INDEPENDENT_AMBULATORY_CARE_PROVIDER_SITE_OTHER): Payer: 59 | Admitting: Family Medicine

## 2016-02-16 VITALS — BP 132/90 | HR 75 | Temp 98.7°F | Wt 285.5 lb

## 2016-02-16 DIAGNOSIS — J3489 Other specified disorders of nose and nasal sinuses: Secondary | ICD-10-CM | POA: Diagnosis not present

## 2016-02-16 NOTE — Progress Notes (Signed)
Subjective:    Patient ID: Tammie Murphy, female    DOB: January 08, 1976, 40 y.o.   MRN: [id number]  HPI This is a 40 yo female who presents today with sinus pressure 6 days. Left side worse than right. Had a couple of episodes of vertigo over last several days. Intermittent ear pressure. Has taken tylenol and afrin nasal spray with some improvement. No cough. Has chronic post nasal drip and has tried flonase in the past which made her feel like her throat was closing up. No cough. Sleeping ok, otherwise feels fine. No fever. 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   Past Medical History:  Diagnosis Date  . GERD (gastroesophageal reflux disease)    Past Surgical History:  Procedure Laterality Date  . TUBAL LIGATION    . WISDOM TOOTH EXTRACTION     Family History  Problem Relation Age of Onset  . Hypertension Mother   . Alcohol abuse Father   . AAA (abdominal aortic aneurysm) Father   . Hypertension Father   . AAA (abdominal aortic aneurysm) Maternal Grandmother   . Hypertension Maternal Grandfather   . Heart attack Maternal Grandfather   . Heart disease Maternal Grandfather   . AAA (abdominal aortic aneurysm) Paternal Grandmother   . Alzheimer's disease Paternal Grandmother   . Prostate cancer Paternal Grandfather   . Heart disease Paternal Grandfather   . Breast cancer Neg Hx    Social History  Substance Use Topics  . Smoking status: Former Games developermoker  . Smokeless tobacco: Never Used  . Alcohol use No      Review of Systems Per HPI    Objective:   Physical Exam  Constitutional: She appears well-developed and well-nourished. No distress.  HENT:  Head: Normocephalic and atraumatic.  Right Ear: Tympanic membrane, external ear and ear canal normal.  Left Ear: Tympanic membrane, external ear and ear canal normal.  Nose: No mucosal edema or rhinorrhea. Right sinus exhibits no maxillary sinus tenderness and no frontal sinus tenderness. Left sinus exhibits no  maxillary sinus tenderness and no frontal sinus tenderness.  Mouth/Throat: Uvula is midline, oropharynx is clear and moist and mucous membranes are normal.  Neck: Normal range of motion.  Cardiovascular: Normal rate, regular rhythm and normal heart sounds.   Pulmonary/Chest: Effort normal and breath sounds normal.  Lymphadenopathy:    She has no cervical adenopathy.  Neurological: She is alert.  Skin: Skin is warm and dry. She is not diaphoretic.  Psychiatric: She has a normal mood and affect. Her behavior is normal. Judgment and thought content normal.  Vitals reviewed.    BP 132/90 (BP Location: Left Arm, Patient Position: Sitting, Cuff Size: Normal)   Pulse 75   Temp 98.7 F (37.1 C) (Oral)   Wt 285 lb 8 oz (129.5 kg)   LMP 02/12/2016   SpO2 98%   BMI 46.43 kg/m  Wt Readings from Last 3 Encounters:  02/16/16 285 lb 8 oz (129.5 kg)  12/23/15 283 lb 8 oz (128.6 kg)  12/10/15 283 lb 6.4 oz (128.5 kg)        Assessment & Plan:  1. Sinus pressure - Patient Instructions  I think your symptoms have been triggered by a virus or seasonal allergies Please try daily sudafed if you can tolerate, continue Afrin twice a day for 4 days straight max Add saline nasal spray 4-6 times a day If not better in a couple of days, please send me a mychart message and we'll  consider a short course of prednisone  Olean Reeeborah Paxton Kanaan, FNP-BC  [facility]  02/16/2016 9:04 AM

## 2016-02-16 NOTE — Patient Instructions (Signed)
I think your symptoms have been triggered by a virus or seasonal allergies Please try daily sudafed if you can tolerate, continue Afrin twice a day for 4 days straight max Add saline nasal spray 4-6 times a day If not better in a couple of days, please send me a mychart message and we'll consider a short course of prednisone

## 2016-02-16 NOTE — Progress Notes (Signed)
Pre visit review using our clinic review tool, if applicable. No additional management support is needed unless otherwise documented below in the visit note. 

## 2016-02-17 ENCOUNTER — Other Ambulatory Visit: Payer: Self-pay | Admitting: *Deleted

## 2016-02-17 ENCOUNTER — Inpatient Hospital Stay
Admission: RE | Admit: 2016-02-17 | Discharge: 2016-02-17 | Disposition: A | Discharge status: Home / Self Care | Payer: Self-pay | Source: Ambulatory Visit | Attending: *Deleted | Admitting: *Deleted

## 2016-02-17 DIAGNOSIS — Z9289 Personal history of other medical treatment: Secondary | ICD-10-CM

## 2016-04-23 ENCOUNTER — Encounter: Payer: Self-pay | Admitting: Family Medicine

## 2016-04-25 ENCOUNTER — Other Ambulatory Visit: Payer: Self-pay | Admitting: Family Medicine

## 2016-04-25 MED ORDER — FLUCONAZOLE 150 MG PO TABS: 150.0000 mg | ORAL_TABLET | Freq: Once | ORAL | 0 refills | Status: AC

## 2016-04-25 MED ORDER — FLUCONAZOLE 150 MG PO TABS: 150.0000 mg | ORAL_TABLET | Freq: Once | ORAL | 0 refills | Status: DC

## 2016-05-06 ENCOUNTER — Encounter: Payer: Self-pay | Admitting: Family Medicine

## 2016-05-30 ENCOUNTER — Encounter: Payer: Self-pay | Admitting: Family Medicine

## 2016-05-31 ENCOUNTER — Other Ambulatory Visit: Payer: Self-pay | Admitting: Family Medicine

## 2016-05-31 MED ORDER — RANITIDINE HCL 150 MG PO TABS: 150.0000 mg | ORAL_TABLET | Freq: Two times a day (BID) | ORAL | 3 refills | Status: DC

## 2016-08-31 ENCOUNTER — Other Ambulatory Visit: Payer: Self-pay

## 2016-08-31 MED ORDER — RANITIDINE HCL 150 MG PO TABS: 150.0000 mg | ORAL_TABLET | Freq: Two times a day (BID) | ORAL | 3 refills | Status: DC

## 2016-10-05 IMAGING — US US TRANSVAGINAL NON-OB
1 series · 13 of 25 positions shown · non-contrast
Comparison: Abdominal ultrasound 12/20/2013.

CLINICAL DATA: Patient with bilateral lower abdominal pain, left
greater than right for 1 week.

EXAM:
TRANSABDOMINAL AND TRANSVAGINAL ULTRASOUND OF PELVIS
TECHNIQUE: Both transabdominal and transvaginal ultrasound examinations of the
pelvis were performed. Transabdominal technique was performed for
global imaging of the pelvis including uterus, ovaries, adnexal
regions, and pelvic cul-de-sac. It was necessary to proceed with
endovaginal exam following the transabdominal exam to visualize the
endometrium and adnexal structures.

[Series 1: us transvaginal non-ob · 0.27mm/px · 13 of 184 slices shown]
[im 1/184]
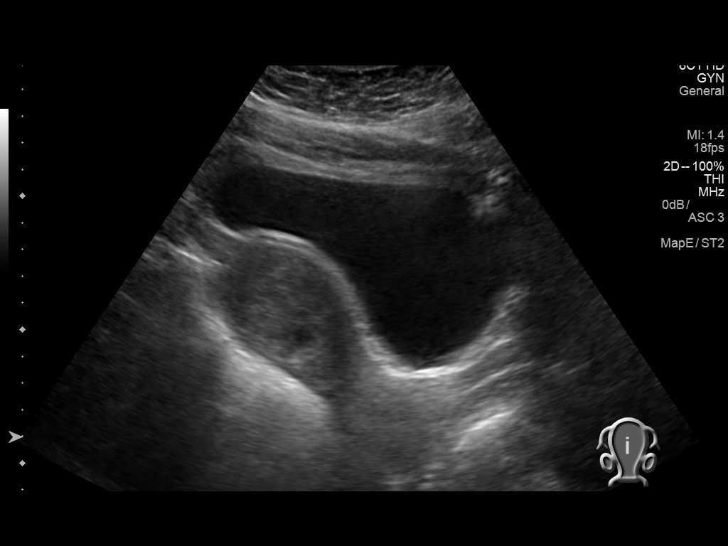
[im 16/184]
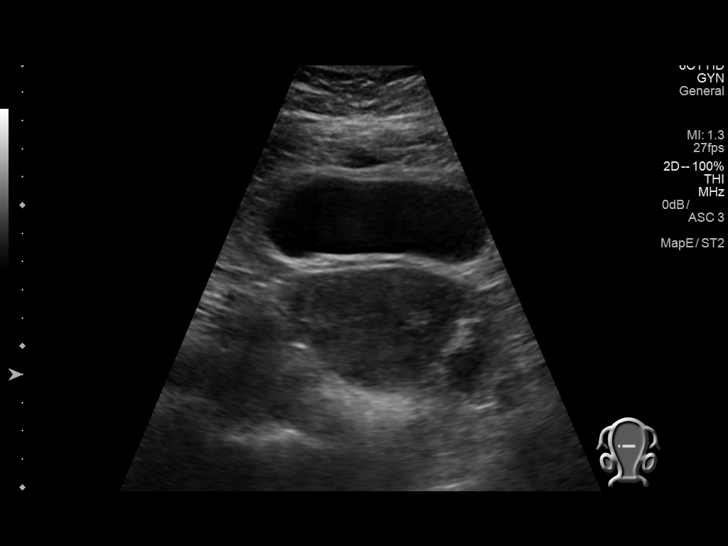
[im 31/184]
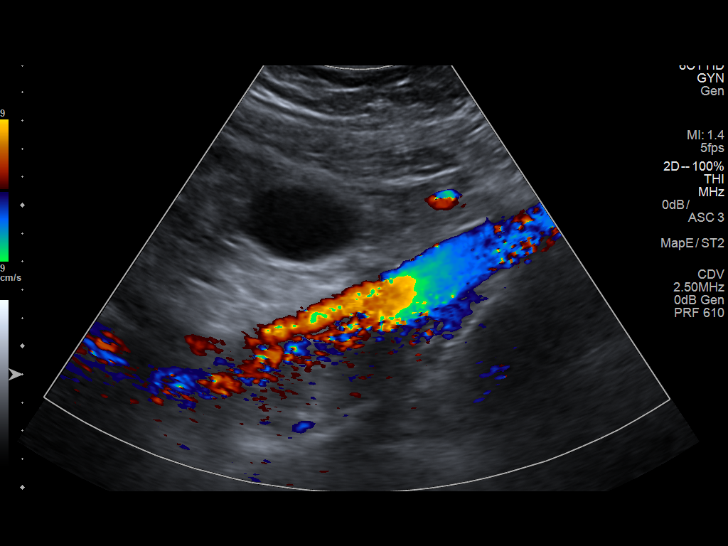
[im 46/184]
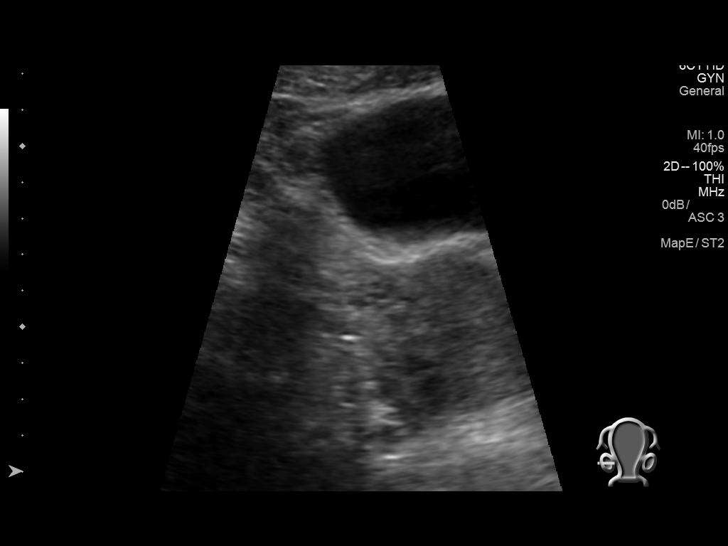
[im 62/184]
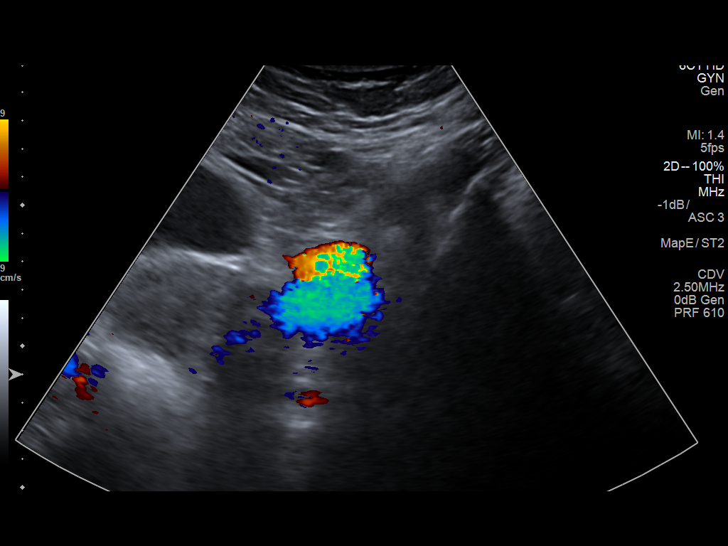
[im 77/184]
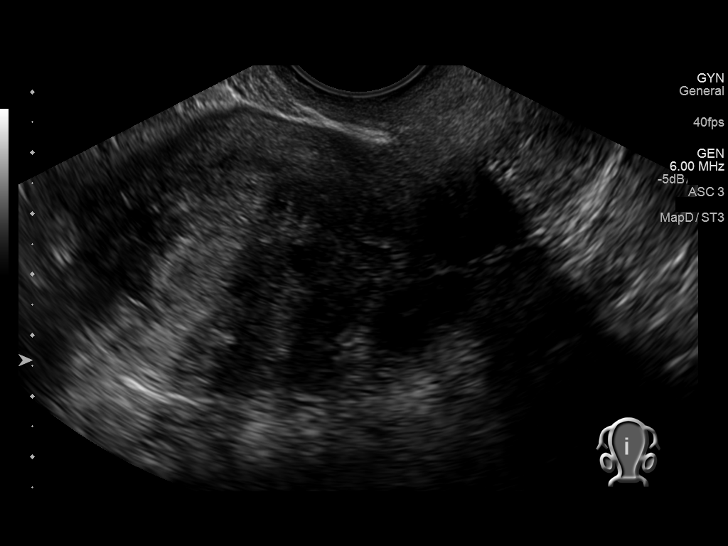
[im 92/184]
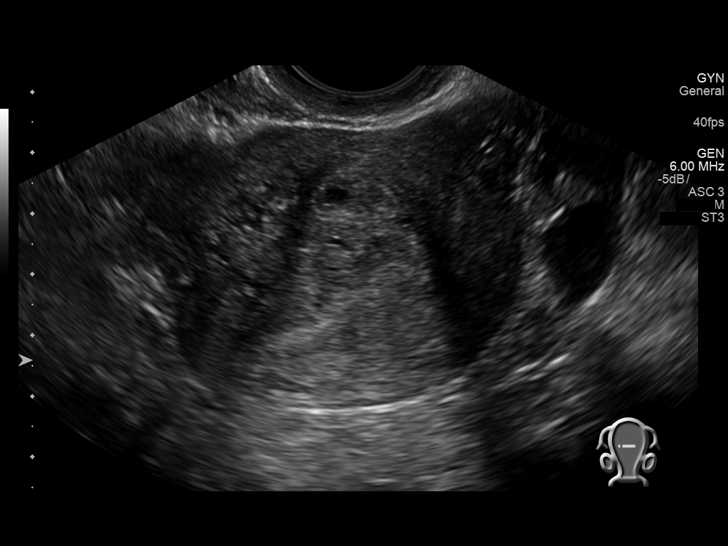
[im 107/184]
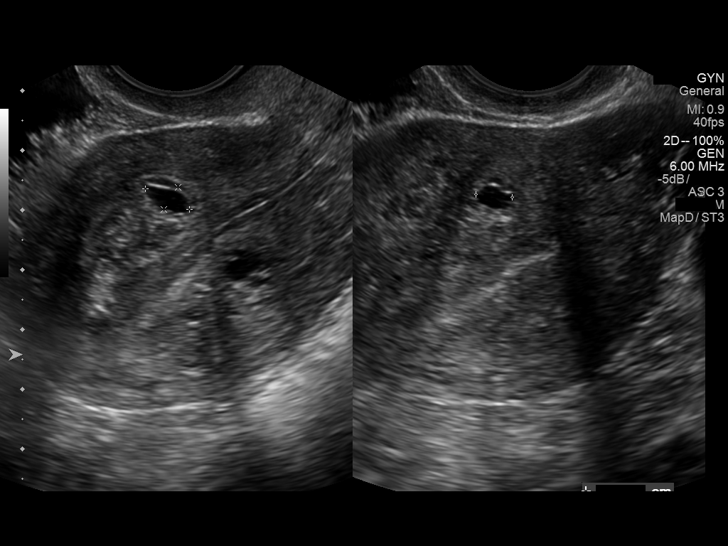
[im 123/184]
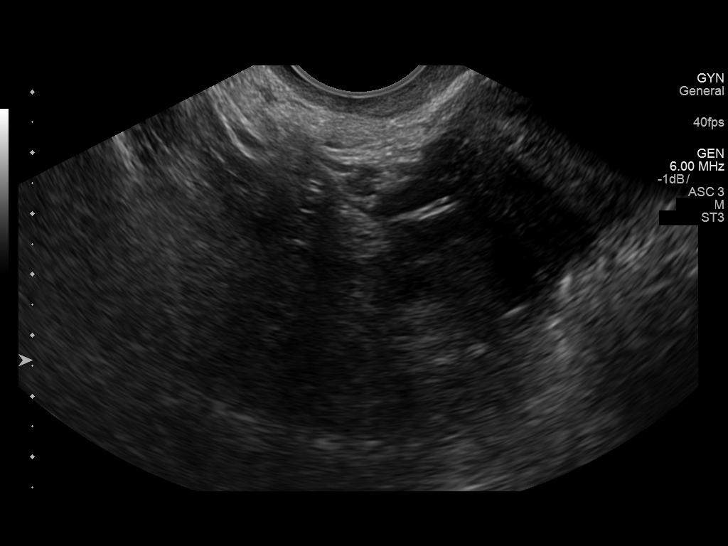
[im 138/184]
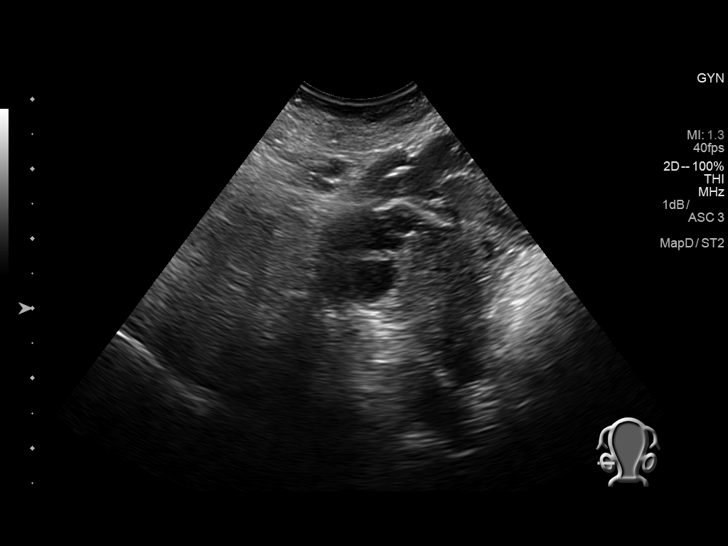
[im 153/184]
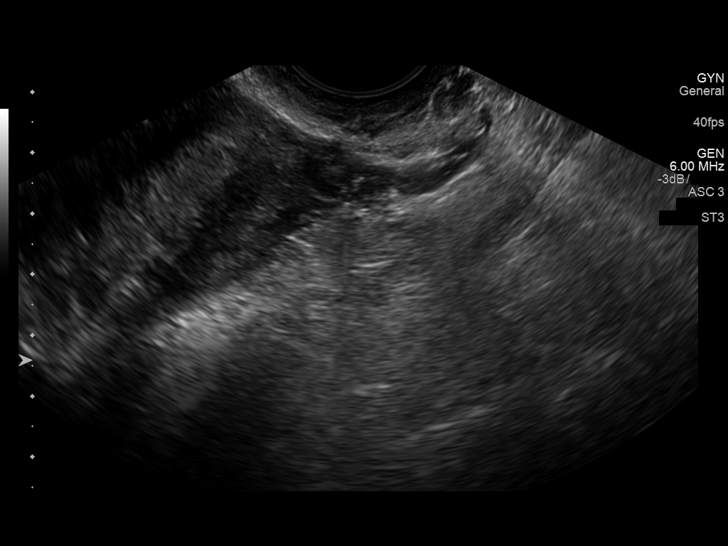
[im 168/184]
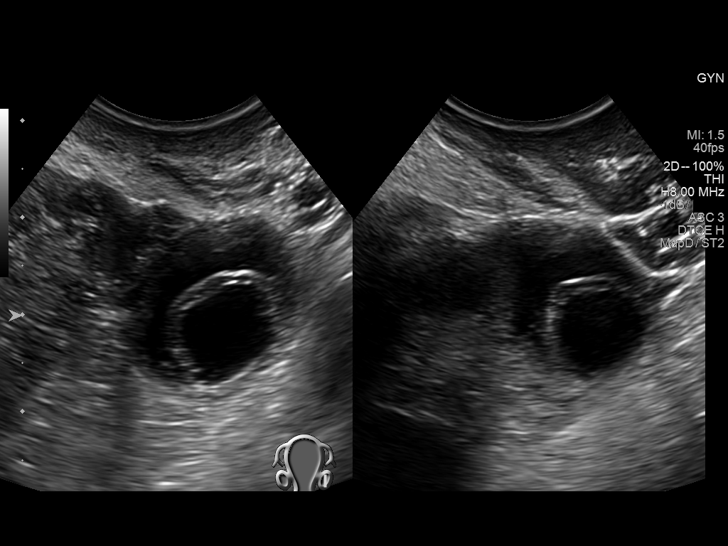
[im 184/184]
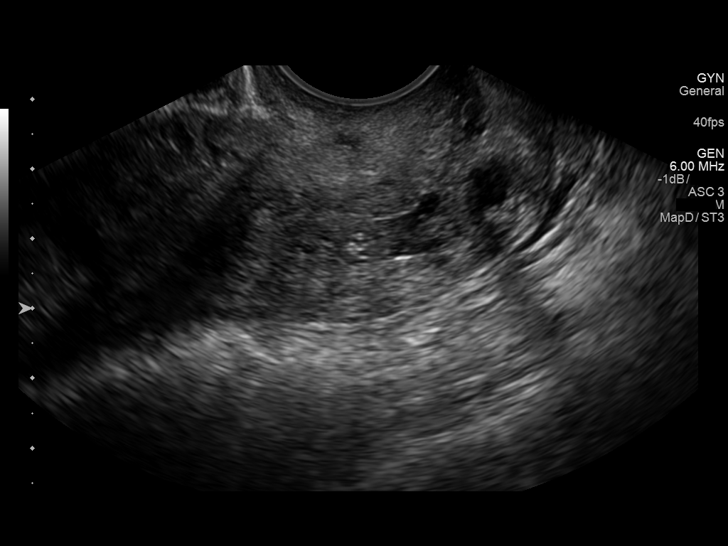

[13 of 25 positions shown; findings below may reference images not displayed]

FINDINGS: Uterus

Measurements: 9.4 x 4.9 x 5.6 cm. No fibroids identified. Multiple
cysts are demonstrated within the myometrium measuring up 1.1 cm.

Endometrium

Thickness: 9 mm.  No focal abnormality visualized.

Right ovary

Measurements: 3.4 x 2.1 x 2.4 cm. Normal appearance/no adnexal mass.

Left ovary

Measurements: 4.7 x 2.2 x 1.9 cm.. Normal appearance/no adnexal
mass. Multiple prominent follicles.

Other findings

No abnormal free fluid.
IMPRESSION: No acute process identified within the pelvis.

There are a larger cysts demonstrated within myometrium. This is
nonspecific in etiology however raises the possibility of
adenomyosis.

## 2016-12-09 ENCOUNTER — Other Ambulatory Visit: Payer: Self-pay | Admitting: Family Medicine

## 2016-12-09 DIAGNOSIS — Z01419 Encounter for gynecological examination (general) (routine) without abnormal findings: Secondary | ICD-10-CM

## 2016-12-20 ENCOUNTER — Other Ambulatory Visit (INDEPENDENT_AMBULATORY_CARE_PROVIDER_SITE_OTHER): Payer: 59

## 2016-12-20 DIAGNOSIS — Z01419 Encounter for gynecological examination (general) (routine) without abnormal findings: Secondary | ICD-10-CM | POA: Diagnosis not present

## 2016-12-20 NOTE — Addendum Note (Signed)
Addended by: Alvina Chou on: 12/20/2016 08:20 AM   Modules accepted: Orders

## 2016-12-21 LAB — COMPREHENSIVE METABOLIC PANEL
A/G RATIO: 1.7 (ref 1.2–2.2)
ALBUMIN: 4 g/dL (ref 3.5–5.5)
ALT: 12 IU/L (ref 0–32)
AST: 10 IU/L (ref 0–40)
Alkaline Phosphatase: 93 IU/L (ref 39–117)
BILIRUBIN TOTAL: 0.2 mg/dL (ref 0.0–1.2)
BUN / CREAT RATIO: 14 (ref 9–23)
BUN: 12 mg/dL (ref 6–24)
CHLORIDE: 104 mmol/L (ref 96–106)
CO2: 24 mmol/L (ref 20–29)
Calcium: 9 mg/dL (ref 8.7–10.2)
Creatinine, Ser: 0.84 mg/dL (ref 0.57–1.00)
GFR calc non Af Amer: 86 mL/min/{1.73_m2} (ref >59)
GFR, EST AFRICAN AMERICAN: 99 mL/min/{1.73_m2} (ref >59)
Globulin, Total: 2.3 g/dL (ref 1.5–4.5)
Glucose: 90 mg/dL (ref 65–99)
POTASSIUM: 5 mmol/L (ref 3.5–5.2)
SODIUM: 139 mmol/L (ref 134–144)
TOTAL PROTEIN: 6.3 g/dL (ref 6.0–8.5)

## 2016-12-21 LAB — TSH: TSH: 1.82 u[IU]/mL (ref 0.450–4.500)

## 2016-12-21 LAB — CBC WITH DIFFERENTIAL/PLATELET
BASOS: 0 %
Basophils Absolute: 0 10*3/uL (ref 0.0–0.2)
EOS (ABSOLUTE): 0.1 10*3/uL (ref 0.0–0.4)
Eos: 1 %
Hematocrit: 38.9 % (ref 34.0–46.6)
Hemoglobin: 12.3 g/dL (ref 11.1–15.9)
IMMATURE GRANS (ABS): 0 10*3/uL (ref 0.0–0.1)
Immature Granulocytes: 0 %
LYMPHS: 30 %
Lymphocytes Absolute: 2.1 10*3/uL (ref 0.7–3.1)
MCH: 27.1 pg (ref 26.6–33.0)
MCHC: 31.6 g/dL (ref 31.5–35.7)
MCV: 86 fL (ref 79–97)
Monocytes Absolute: 0.4 10*3/uL (ref 0.1–0.9)
Monocytes: 5 %
NEUTROS ABS: 4.5 10*3/uL (ref 1.4–7.0)
Neutrophils: 64 %
PLATELETS: 315 10*3/uL (ref 150–379)
RBC: 4.54 x10E6/uL (ref 3.77–5.28)
RDW: 14.1 % (ref 12.3–15.4)
WBC: 7 10*3/uL (ref 3.4–10.8)

## 2016-12-21 LAB — LIPID PANEL
Chol/HDL Ratio: 4.1 ratio (ref 0.0–4.4)
Cholesterol, Total: 192 mg/dL (ref 100–199)
HDL: 47 mg/dL (ref >39)
LDL Calculated: 132 mg/dL — ABNORMAL HIGH (ref 0–99)
Triglycerides: 65 mg/dL (ref 0–149)
VLDL Cholesterol Cal: 13 mg/dL (ref 5–40)

## 2016-12-22 ENCOUNTER — Encounter: Payer: Self-pay | Admitting: Family Medicine

## 2016-12-22 ENCOUNTER — Ambulatory Visit (INDEPENDENT_AMBULATORY_CARE_PROVIDER_SITE_OTHER): Payer: 59 | Admitting: Family Medicine

## 2016-12-22 VITALS — BP 128/94 | HR 98 | Temp 98.5°F | Ht 66.0 in | Wt 281.8 lb

## 2016-12-22 DIAGNOSIS — Z23 Encounter for immunization: Secondary | ICD-10-CM

## 2016-12-22 DIAGNOSIS — D509 Iron deficiency anemia, unspecified: Secondary | ICD-10-CM | POA: Diagnosis not present

## 2016-12-22 DIAGNOSIS — Z01419 Encounter for gynecological examination (general) (routine) without abnormal findings: Secondary | ICD-10-CM | POA: Diagnosis not present

## 2016-12-22 DIAGNOSIS — K219 Gastro-esophageal reflux disease without esophagitis: Secondary | ICD-10-CM

## 2016-12-22 MED ORDER — RANITIDINE HCL 150 MG PO TABS: 150.0000 mg | ORAL_TABLET | Freq: Two times a day (BID) | ORAL | 3 refills | Status: DC

## 2016-12-22 NOTE — Assessment & Plan Note (Signed)
Well controlled on current dose of zantac. eRx refills sent.

## 2016-12-22 NOTE — Patient Instructions (Signed)
Happy Birthday!

## 2016-12-22 NOTE — Assessment & Plan Note (Signed)
Reviewed preventive care protocols, scheduled due services, and updated immunizations Discussed nutrition, exercise, diet, and healthy lifestyle.  Influenza and tdap vaccines given today.

## 2016-12-22 NOTE — Assessment & Plan Note (Signed)
Much improved with oral iron.

## 2016-12-22 NOTE — Addendum Note (Signed)
Addended by: Lerry Liner on: 12/22/2016 12:44 PM   Modules accepted: Orders

## 2016-12-22 NOTE — Progress Notes (Signed)
Subjective:   Patient ID: Tammie Murphy, female    DOB: [birth year], 41 y.o.   MRN: [id number]  Tammie Murphy is a pleasant 41 y.o. year old female who presents to clinic today with Annual Exam (Patient is here today for a CPE. She is needing her Tdap & Flu shots today. )  on 12/22/2016  HPI:  G3P1 here for CPX.  Mammogram 02/18/16  Neg pap smear 12/18/14 (done by me).   Sexually active with husband only.  Remote h/o BTL.   GERD- Zantac is working well.  Needs refills today.   Iron deficiency- Much improved with oral iron.  Lab Results  Component Value Date   WBC 7.0 12/20/2016   HGB 12.3 12/20/2016   HCT 38.9 12/20/2016   MCV 86 12/20/2016   PLT 315 12/20/2016     Lab Results  Component Value Date   CHOL 192 12/20/2016   HDL 47 12/20/2016   LDLCALC 132 (H) 12/20/2016   TRIG 65 12/20/2016   CHOLHDL 4.1 12/20/2016   Lab Results  Component Value Date   CREATININE 0.84 12/20/2016   Lab Results  Component Value Date   NA 139 12/20/2016   K 5.0 12/20/2016   CL 104 12/20/2016   CO2 24 12/20/2016   Lab Results  Component Value Date   ALT 12 12/20/2016   AST 10 12/20/2016   ALKPHOS 93 12/20/2016   BILITOT 0.2 12/20/2016   Lab Results  Component Value Date   TSH 1.820 12/20/2016    Current Outpatient Prescriptions on File Prior to Visit  Medication Sig Dispense Refill  . ferrous sulfate 325 (65 FE) MG tablet Take 1 tablet (325 mg total) by mouth daily with breakfast. 90 tablet 3  . polyethylene glycol (MIRALAX / GLYCOLAX) packet Take 17 g by mouth daily.    . ranitidine (ZANTAC) 150 MG tablet Take 1 tablet (150 mg total) by mouth 2 (two) times daily. 60 tablet 3   No current facility-administered medications on file prior to visit.     Allergies  Allergen Reactions  . Flonase [Fluticasone Propionate] Anaphylaxis    Past Medical History:  Diagnosis Date  . GERD (gastroesophageal reflux disease)     Past Surgical History:  Procedure  Laterality Date  . TUBAL LIGATION    . WISDOM TOOTH EXTRACTION      Family History  Problem Relation Age of Onset  . Hypertension Mother   . Alcohol abuse Father   . AAA (abdominal aortic aneurysm) Father   . Hypertension Father   . AAA (abdominal aortic aneurysm) Maternal Grandmother   . Hypertension Maternal Grandfather   . Heart attack Maternal Grandfather   . Heart disease Maternal Grandfather   . AAA (abdominal aortic aneurysm) Paternal Grandmother   . Alzheimer's disease Paternal Grandmother   . Prostate cancer Paternal Grandfather   . Heart disease Paternal Grandfather   . Breast cancer Neg Hx     Social History   Social History  . Marital status: Married    Spouse name: N/A  . Number of children: N/A  . Years of education: N/A   Occupational History  . Not on file.   Social History Main Topics  . Smoking status: Former Games developer  . Smokeless tobacco: Never Used  . Alcohol use No  . Drug use: No  . Sexual activity: Yes   Other Topics Concern  . Not on file   Social History Narrative  . No narrative on file  The PMH, PSH, Social History, Family History, Medications, and allergies have been reviewed in [facility], and have been updated if relevant.     Review of Systems  Constitutional: Negative for fatigue.  HENT: Negative for congestion, rhinorrhea and sinus pressure.   Respiratory: Negative.   Cardiovascular: Negative.   Gastrointestinal: Negative.   Endocrine: Negative.   Genitourinary: Negative.   Skin: Negative.   Allergic/Immunologic: Negative.   Neurological: Negative.   Hematological: Negative.   Psychiatric/Behavioral: Negative.   All other systems reviewed and are negative.      Objective:    BP (!) 128/94 (BP Location: Left Arm, Patient Position: Sitting, Cuff Size: Large)   Pulse 98   Temp 98.5 F (36.9 C) (Oral)   Ht  (1.676 m)   Wt 281 lb 12.8 oz (127.8 kg)   LMP 12/02/2016   SpO2 96%   BMI 45.48 kg/m    Physical  Exam   General:  Well-developed,well-nourished,in no acute distress; alert,appropriate and cooperative throughout examination Head:  normocephalic and atraumatic.   Eyes:  vision grossly intact, PERRL Ears:  R ear normal and L ear normal externally, TMs clear bilaterally Nose:  no external deformity.   Mouth:  good dentition.   Neck:  No deformities, masses, or tenderness noted. Breasts:  No mass, nodules, thickening, tenderness, bulging, retraction, inflamation, nipple discharge or skin changes noted.   Lungs:  Normal respiratory effort, chest expands symmetrically. Lungs are clear to auscultation, no crackles or wheezes. Heart:  Normal rate and regular rhythm. S1 and S2 normal without gallop, murmur, click, rub or other extra sounds. Abdomen:  Bowel sounds positive,abdomen soft and non-tender without masses, organomegaly or hernias noted. Msk:  No deformity or scoliosis noted of thoracic or lumbar spine.   Extremities:  No clubbing, cyanosis, edema, or deformity noted with normal full range of motion of all joints.   Neurologic:  alert & oriented X3 and gait normal.   Skin:  Intact without suspicious lesions or rashes Cervical Nodes:  No lymphadenopathy noted Axillary Nodes:  No palpable lymphadenopathy Psych:  Cognition and judgment appear intact. Alert and cooperative with normal attention span and concentration. No apparent delusions, illusions, hallucinations          Assessment & Plan:   Well woman exam with routine gynecological exam No Follow-up on file.

## 2016-12-27 ENCOUNTER — Encounter: Payer: 59 | Admitting: Family Medicine

## 2017-11-17 ENCOUNTER — Other Ambulatory Visit: Payer: Self-pay | Admitting: Family Medicine

## 2017-12-26 ENCOUNTER — Ambulatory Visit: Payer: 59 | Admitting: Family Medicine

## 2018-01-11 ENCOUNTER — Encounter: Payer: 59 | Admitting: Family Medicine

## 2018-03-05 ENCOUNTER — Ambulatory Visit (INDEPENDENT_AMBULATORY_CARE_PROVIDER_SITE_OTHER): Payer: Managed Care, Other (non HMO) | Admitting: Family Medicine

## 2018-03-05 ENCOUNTER — Encounter: Payer: Self-pay | Admitting: Family Medicine

## 2018-03-05 VITALS — BP 126/82 | HR 72 | Temp 98.3°F | Ht 66.0 in | Wt 266.0 lb

## 2018-03-05 DIAGNOSIS — Z01419 Encounter for gynecological examination (general) (routine) without abnormal findings: Secondary | ICD-10-CM

## 2018-03-05 DIAGNOSIS — Z0000 Encounter for general adult medical examination without abnormal findings: Secondary | ICD-10-CM | POA: Diagnosis not present

## 2018-03-05 DIAGNOSIS — E66812 Obesity, class 2: Secondary | ICD-10-CM | POA: Insufficient documentation

## 2018-03-05 DIAGNOSIS — E669 Obesity, unspecified: Secondary | ICD-10-CM | POA: Insufficient documentation

## 2018-03-05 DIAGNOSIS — K219 Gastro-esophageal reflux disease without esophagitis: Secondary | ICD-10-CM

## 2018-03-05 DIAGNOSIS — Z23 Encounter for immunization: Secondary | ICD-10-CM

## 2018-03-05 DIAGNOSIS — D509 Iron deficiency anemia, unspecified: Secondary | ICD-10-CM

## 2018-03-05 DIAGNOSIS — Z6841 Body Mass Index (BMI) 40.0 and over, adult: Secondary | ICD-10-CM

## 2018-03-05 NOTE — Addendum Note (Signed)
Addended by: Eual FinesBRIDGES, SHANNON P on: 03/05/2018 12:27 PM   Modules accepted: Orders

## 2018-03-05 NOTE — Patient Instructions (Addendum)
[facility]  Can try a trial of prevacid x 4-6 weeks then wean. Avoid acidic, spicy foods.  Avoid large meal size and eating late. Call to schedule  Mammogram on your own.  Great work on [employer] loss, keep working on [employer] and [employer] diet.

## 2018-03-05 NOTE — Assessment & Plan Note (Signed)
Encouraged exercise, weight loss, healthy eating habits. ? ?

## 2018-03-05 NOTE — Progress Notes (Signed)
Subjective:    Patient ID: Barrie DunkerJohanna C Yow, female    DOB: March 22, 1975, 42 y.o.   MRN: [id number]  HPI 42 year old previous pt of Dr. Elmer SowAron's presents to transfer care. Last OV CPX in 2018   Due now for CPX.  Due for lab eval.   Anemia,  Secondary to heavy menses: on iron daily.   GERD:  Using prevacid off and on since zantac has been recalled. Reflux occurring every othe day.    20 lbs weight loss in last year. Wt Readings from Last 3 Encounters:  03/05/18 266 lb (120.7 kg)  12/22/16 281 lb 12.8 oz (127.8 kg)  02/16/16 285 lb 8 oz (129.5 kg)     Blood pressure 126/82, pulse 72, temperature 98.3 F (36.8 C), temperature source Oral, height 5\' 6"  (1.676 m), weight 266 lb (120.7 kg), last menstrual period 02/20/2018, SpO2 98 %. Social History /Family History/Past Medical History reviewed in detail and updated in EMR if needed.   Review of Systems  Constitutional: Negative for fatigue and fever.  HENT: Negative for congestion.   Eyes: Negative for pain.  Respiratory: Negative for cough and shortness of breath.   Cardiovascular: Negative for chest pain, palpitations and leg swelling.  Gastrointestinal: Negative for abdominal pain.  Genitourinary: Negative for dysuria and vaginal bleeding.  Musculoskeletal: Negative for back pain.  Neurological: Negative for syncope, light-headedness and headaches.  Psychiatric/Behavioral: Negative for dysphoric mood.       Objective:   Physical Exam Constitutional:      General: She is not in acute distress.    Appearance: Normal appearance. She is well-developed. She is obese. She is not ill-appearing or toxic-appearing.  HENT:     Head: Normocephalic.     Right Ear: Hearing, tympanic membrane, ear canal and external ear normal.     Left Ear: Hearing, tympanic membrane, ear canal and external ear normal.     Nose: Nose normal.  Eyes:     General: Lids are normal. Lids are everted, no foreign bodies appreciated.   Conjunctiva/sclera: Conjunctivae normal.     Pupils: Pupils are equal, round, and reactive to light.  Neck:     Musculoskeletal: Normal range of motion and neck supple.     Thyroid: No thyroid mass or thyromegaly.     Vascular: No carotid bruit.     Trachea: Trachea normal.  Cardiovascular:     Rate and Rhythm: Normal rate and regular rhythm.     Heart sounds: Normal heart sounds, S1 normal and S2 normal. No murmur. No gallop.   Pulmonary:     Effort: Pulmonary effort is normal. No respiratory distress.     Breath sounds: Normal breath sounds. No wheezing, rhonchi or rales.  Abdominal:     General: Bowel sounds are normal. There is no distension or abdominal bruit.     Palpations: Abdomen is soft. There is no fluid wave or mass.     Tenderness: There is no abdominal tenderness. There is no guarding or rebound.     Hernia: No hernia is present.  Genitourinary:    Exam position: Supine.     Labia:        Right: No rash, tenderness or lesion.        Left: No rash, tenderness or lesion.      Vagina: Normal.     Cervix: No cervical motion tenderness, discharge or friability.     Uterus: Not enlarged and not tender.      Adnexa:  Right: No mass, tenderness or fullness.         Left: No mass, tenderness or fullness.    Lymphadenopathy:     Cervical: No cervical adenopathy.  Skin:    General: Skin is warm and dry.     Findings: No rash.  Neurological:     Mental Status: She is alert.     Cranial Nerves: No cranial nerve deficit.     Sensory: No sensory deficit.  Psychiatric:        Mood and Affect: Mood is not anxious or depressed.        Speech: Speech normal.        Behavior: Behavior normal. Behavior is cooperative.        Judgment: Judgment normal.           Assessment & Plan:  The patient's preventative maintenance and recommended screening tests for an annual wellness exam were reviewed in full today. Brought up to date unless services declined.  Counselled  on the importance of diet, exercise, and its role in overall health and mortality. The patient's FH and SH was reviewed, including their home life, tobacco status, and drug and alcohol status.   Vaccines: Uptodate with td and given flu vaccine today in office. Pap/DVE:  2016 nml pap, no HPV done.. due now for repeat Mammo:  Due Colon: no early family history of colon cancer.  Smoking Status: former smoker < 10 years, quit 16 years ago ETOH/ drug use: social ETOH. No drug use.  HIV screen:   done

## 2018-03-08 ENCOUNTER — Other Ambulatory Visit: Payer: Self-pay | Admitting: Family Medicine

## 2018-03-09 LAB — CBC WITH DIFFERENTIAL/PLATELET
Basophils Absolute: 0.1 10*3/uL (ref 0.0–0.2)
Basos: 1 %
EOS (ABSOLUTE): 0.1 10*3/uL (ref 0.0–0.4)
Eos: 1 %
HEMOGLOBIN: 12.2 g/dL (ref 11.1–15.9)
Hematocrit: 36.5 % (ref 34.0–46.6)
Immature Grans (Abs): 0 10*3/uL (ref 0.0–0.1)
Immature Granulocytes: 0 %
Lymphocytes Absolute: 2.1 10*3/uL (ref 0.7–3.1)
Lymphs: 33 %
MCH: 28.6 pg (ref 26.6–33.0)
MCHC: 33.4 g/dL (ref 31.5–35.7)
MCV: 86 fL (ref 79–97)
MONOCYTES: 6 %
Monocytes Absolute: 0.4 10*3/uL (ref 0.1–0.9)
Neutrophils Absolute: 3.7 10*3/uL (ref 1.4–7.0)
Neutrophils: 59 %
Platelets: 334 10*3/uL (ref 150–450)
RBC: 4.27 x10E6/uL (ref 3.77–5.28)
RDW: 12.7 % (ref 12.3–15.4)
WBC: 6.3 10*3/uL (ref 3.4–10.8)

## 2018-03-09 LAB — SPECIMEN STATUS REPORT

## 2018-03-09 LAB — COMPREHENSIVE METABOLIC PANEL
ALBUMIN: 4.1 g/dL (ref 3.5–5.5)
ALT: 15 IU/L (ref 0–32)
AST: 13 IU/L (ref 0–40)
Albumin/Globulin Ratio: 1.6 (ref 1.2–2.2)
Alkaline Phosphatase: 84 IU/L (ref 39–117)
BUN / CREAT RATIO: 10 (ref 9–23)
BUN: 9 mg/dL (ref 6–24)
Bilirubin Total: 0.2 mg/dL (ref 0.0–1.2)
CALCIUM: 9.3 mg/dL (ref 8.7–10.2)
CO2: 21 mmol/L (ref 20–29)
Chloride: 104 mmol/L (ref 96–106)
Creatinine, Ser: 0.86 mg/dL (ref 0.57–1.00)
GFR calc non Af Amer: 82 mL/min/{1.73_m2} (ref >59)
GFR, EST AFRICAN AMERICAN: 95 mL/min/{1.73_m2} (ref >59)
Globulin, Total: 2.5 g/dL (ref 1.5–4.5)
Glucose: 89 mg/dL (ref 65–99)
Potassium: 4.8 mmol/L (ref 3.5–5.2)
Sodium: 142 mmol/L (ref 134–144)
Total Protein: 6.6 g/dL (ref 6.0–8.5)

## 2018-03-09 LAB — LIPID PANEL W/O CHOL/HDL RATIO
Cholesterol, Total: 204 mg/dL — ABNORMAL HIGH (ref 100–199)
HDL: 58 mg/dL (ref >39)
LDL CALC: 131 mg/dL — AB (ref 0–99)
Triglycerides: 73 mg/dL (ref 0–149)
VLDL Cholesterol Cal: 15 mg/dL (ref 5–40)

## 2018-03-10 LAB — PAP LB (LIQUID-BASED)

## 2018-03-12 ENCOUNTER — Other Ambulatory Visit: Payer: Self-pay | Admitting: Family Medicine

## 2018-03-12 DIAGNOSIS — Z1231 Encounter for screening mammogram for malignant neoplasm of breast: Secondary | ICD-10-CM

## 2018-03-13 ENCOUNTER — Ambulatory Visit
Admission: RE | Admit: 2018-03-13 | Discharge: 2018-03-13 | Disposition: A | Discharge status: Home / Self Care | Payer: Managed Care, Other (non HMO) | Source: Ambulatory Visit | Attending: Family Medicine | Admitting: Family Medicine

## 2018-03-13 DIAGNOSIS — Z1231 Encounter for screening mammogram for malignant neoplasm of breast: Secondary | ICD-10-CM | POA: Diagnosis present

## 2018-12-06 ENCOUNTER — Ambulatory Visit (INDEPENDENT_AMBULATORY_CARE_PROVIDER_SITE_OTHER): Payer: Managed Care, Other (non HMO)

## 2018-12-06 DIAGNOSIS — Z23 Encounter for immunization: Secondary | ICD-10-CM

## 2019-02-11 IMAGING — MG DIGITAL SCREENING BILATERAL MAMMOGRAM WITH TOMO AND CAD
8 of 15 series · 8 of 40 positions shown · non-contrast
Comparison: Previous exam(s).

ACR Breast Density Category a: The breast tissue is almost entirely
fatty.

CLINICAL DATA: Screening.

EXAM:
DIGITAL SCREENING BILATERAL MAMMOGRAM WITH TOMO AND CAD

[R XCCL synth-2D]
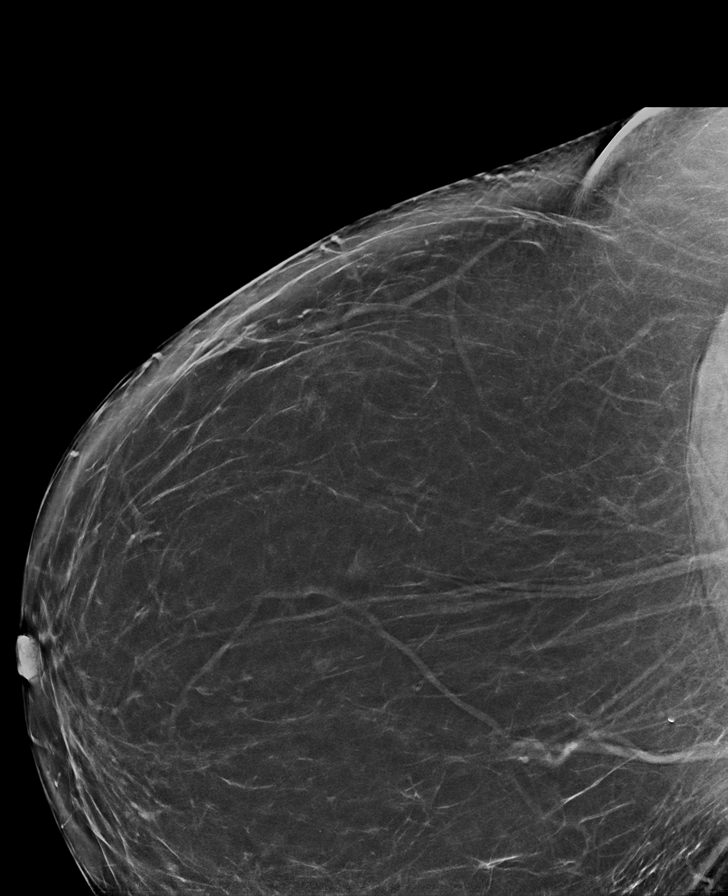

[R CV synth-2D]
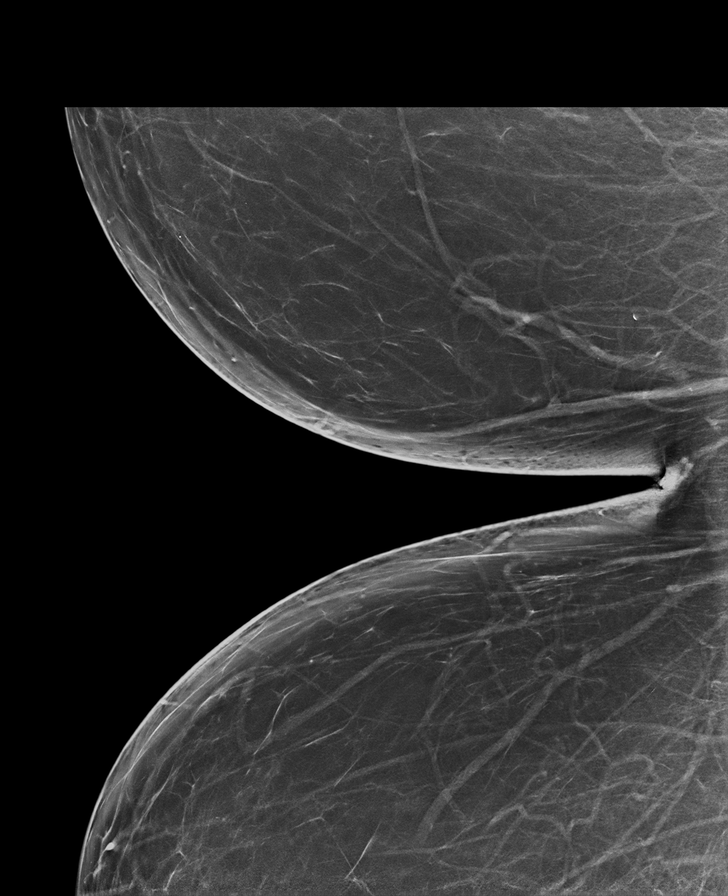

[R CC synth-2D]
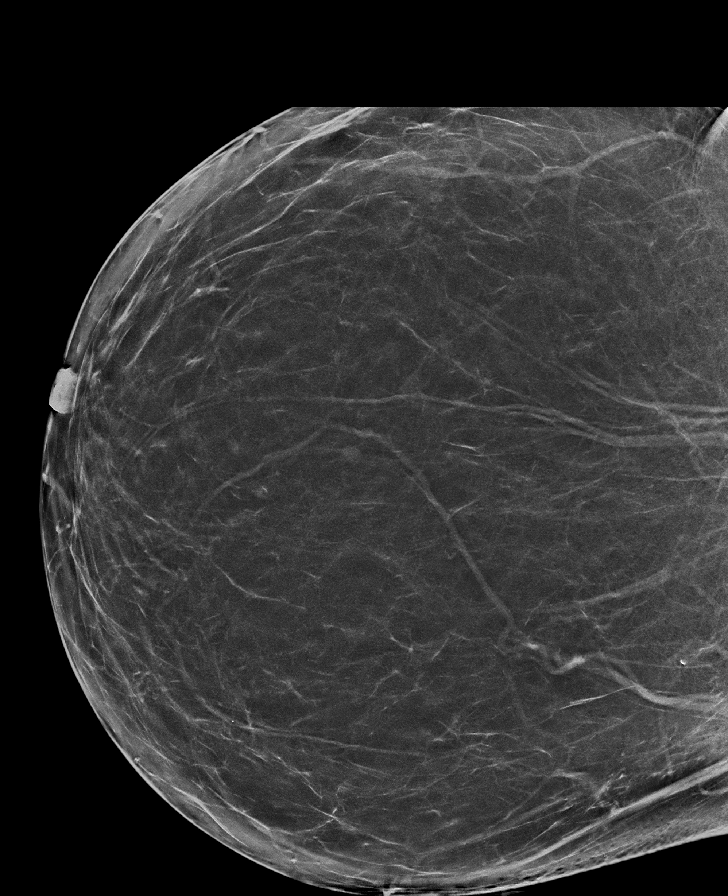

[L MLO synth-2D (1 of 2)]
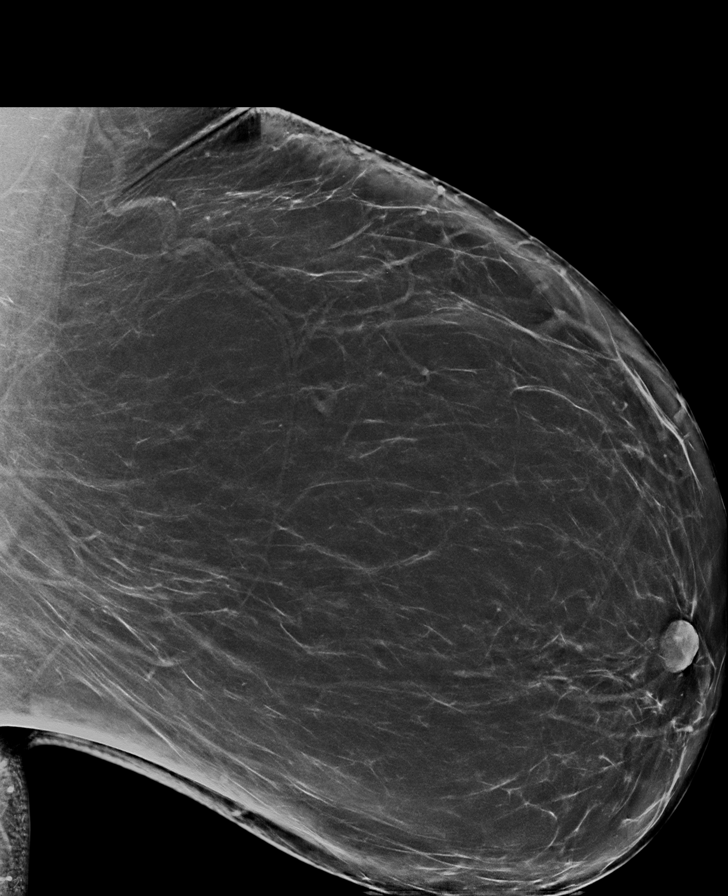

[L CC synth-2D]
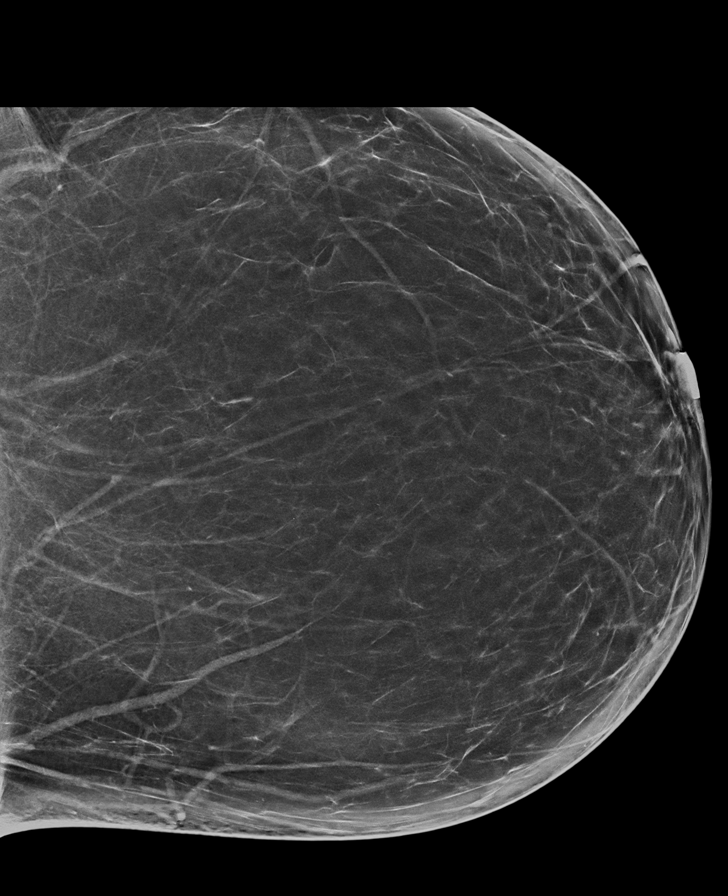

[L MLO synth-2D (2 of 2)]
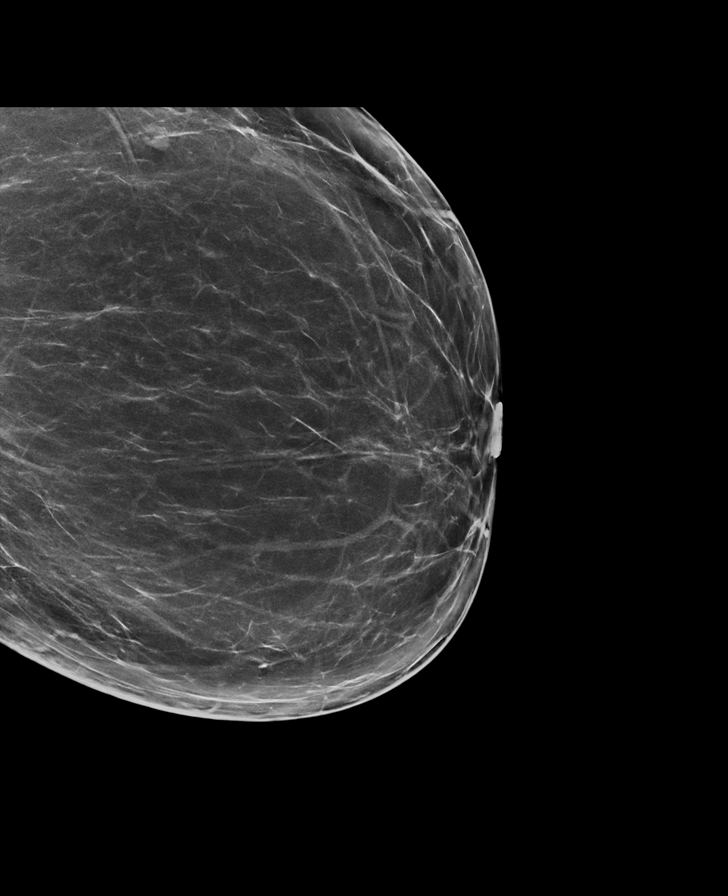

[R MLO synth-2D]
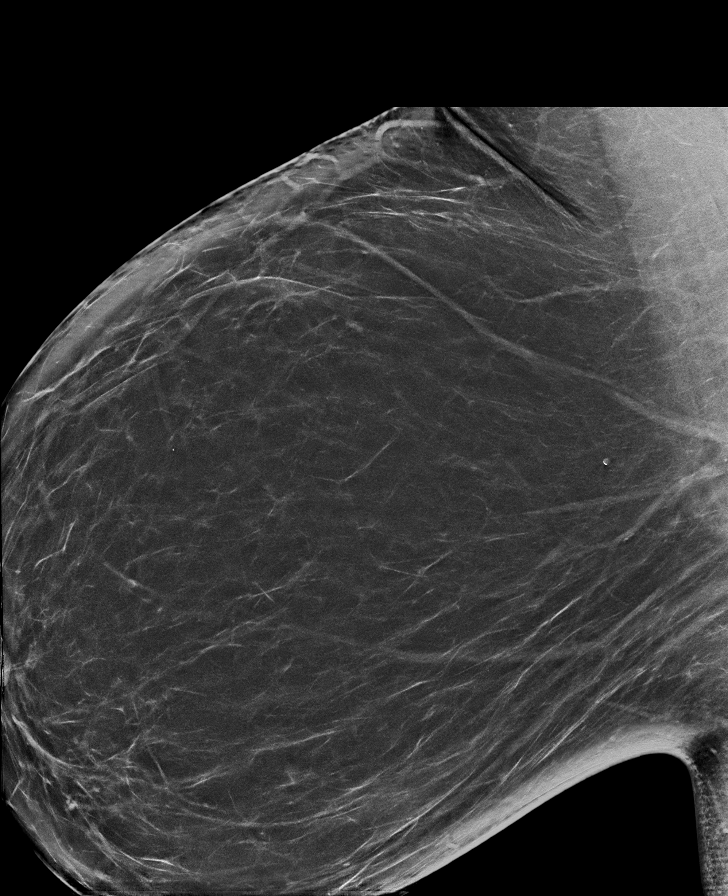

[L MLO tomo · tomo slice 70/102.0]
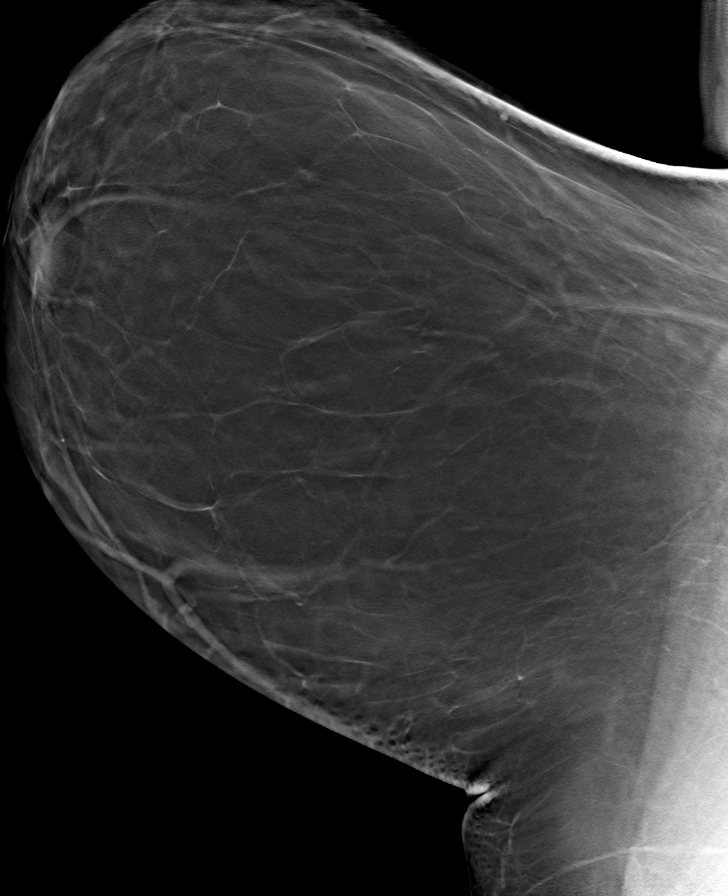

[8 of 40 positions shown; findings below may reference images not displayed]

FINDINGS: There are no findings suspicious for malignancy. Images were
processed with CAD.
IMPRESSION: No mammographic evidence of malignancy. A result letter of this
screening mammogram will be mailed directly to the patient.

RECOMMENDATION:
Screening mammogram in one year. (Code:[id number])

BI-RADS CATEGORY  1: Negative.

## 2019-05-23 ENCOUNTER — Other Ambulatory Visit (INDEPENDENT_AMBULATORY_CARE_PROVIDER_SITE_OTHER): Payer: Managed Care, Other (non HMO)

## 2019-05-23 ENCOUNTER — Other Ambulatory Visit: Payer: Self-pay

## 2019-05-23 ENCOUNTER — Telehealth: Payer: Self-pay | Admitting: Family Medicine

## 2019-05-23 DIAGNOSIS — Z13220 Encounter for screening for lipoid disorders: Secondary | ICD-10-CM

## 2019-05-23 NOTE — Telephone Encounter (Signed)
-----   Message from Alvina Chou sent at 05/09/2019  3:31 PM EST ----- Regarding: lab orders for Thursday, 3.11.21 Patient is scheduled for CPX labs, please order future labs, Thanks , Camelia Eng

## 2019-05-23 NOTE — Addendum Note (Signed)
Addended by: Alvina Chou on: 05/23/2019 12:51 PM   Modules accepted: Orders

## 2019-05-24 LAB — COMPREHENSIVE METABOLIC PANEL
ALT: 11 IU/L (ref 0–32)
AST: 13 IU/L (ref 0–40)
Albumin/Globulin Ratio: 1.9 (ref 1.2–2.2)
Albumin: 4.1 g/dL (ref 3.8–4.8)
Alkaline Phosphatase: 71 IU/L (ref 39–117)
BUN/Creatinine Ratio: 15 (ref 9–23)
BUN: 11 mg/dL (ref 6–24)
Bilirubin Total: 0.3 mg/dL (ref 0.0–1.2)
CO2: 24 mmol/L (ref 20–29)
Calcium: 8.7 mg/dL (ref 8.7–10.2)
Chloride: 105 mmol/L (ref 96–106)
Creatinine, Ser: 0.71 mg/dL (ref 0.57–1.00)
GFR calc Af Amer: 119 mL/min/{1.73_m2} (ref >59)
GFR calc non Af Amer: 103 mL/min/{1.73_m2} (ref >59)
Globulin, Total: 2.2 g/dL (ref 1.5–4.5)
Glucose: 84 mg/dL (ref 65–99)
Potassium: 4.6 mmol/L (ref 3.5–5.2)
Sodium: 140 mmol/L (ref 134–144)
Total Protein: 6.3 g/dL (ref 6.0–8.5)

## 2019-05-24 LAB — LIPID PANEL
Chol/HDL Ratio: 3.5 ratio (ref 0.0–4.4)
Cholesterol, Total: 207 mg/dL — ABNORMAL HIGH (ref 100–199)
HDL: 60 mg/dL (ref >39)
LDL Chol Calc (NIH): 135 mg/dL — ABNORMAL HIGH (ref 0–99)
Triglycerides: 69 mg/dL (ref 0–149)
VLDL Cholesterol Cal: 12 mg/dL (ref 5–40)

## 2019-05-24 NOTE — Progress Notes (Signed)
No critical labs need to be addressed urgently. We will discuss labs in detail at upcoming office visit.   

## 2019-05-30 ENCOUNTER — Encounter: Payer: Self-pay | Admitting: Family Medicine

## 2019-05-30 ENCOUNTER — Ambulatory Visit (INDEPENDENT_AMBULATORY_CARE_PROVIDER_SITE_OTHER): Payer: Managed Care, Other (non HMO) | Admitting: Family Medicine

## 2019-05-30 ENCOUNTER — Other Ambulatory Visit: Payer: Self-pay

## 2019-05-30 VITALS — BP 108/78 | HR 79 | Temp 97.9°F | Ht 66.25 in | Wt 233.2 lb

## 2019-05-30 DIAGNOSIS — E6609 Other obesity due to excess calories: Secondary | ICD-10-CM | POA: Diagnosis not present

## 2019-05-30 DIAGNOSIS — Z6837 Body mass index (BMI) 37.0-37.9, adult: Secondary | ICD-10-CM

## 2019-05-30 DIAGNOSIS — Z0000 Encounter for general adult medical examination without abnormal findings: Secondary | ICD-10-CM | POA: Diagnosis not present

## 2019-05-30 NOTE — Progress Notes (Signed)
Chief Complaint  Patient presents with  . Annual Exam    History of Present Illness: HPI  The patient is here for annual wellness exam and preventative care.    Morbid obesity now in obesity range with 30 lb weight loss!  Body mass index is 37.36 kg/m. Wt Readings from Last 3 Encounters:  05/30/19 233 lb 4 oz (105.8 kg)  03/05/18 266 lb (120.7 kg)  12/22/16 281 lb 12.8 oz (127.8 kg)   Calorie counting to lose weight. Exercise: trying to walk some more.   Reviewed labs in detail with pt.  Lab Results  Component Value Date   CHOL 207 (H) 05/23/2019   HDL 60 05/23/2019   LDLCALC 135 (H) 05/23/2019   TRIG 69 05/23/2019   CHOLHDL 3.5 05/23/2019  The 10-year ASCVD risk score Denman George DC Jr., et al., 2013) is: 0.5%   Values used to calculate the score:     Age: 44 years     Sex: Female     Is Non-Hispanic African American: No     Diabetic: No     Tobacco smoker: No     Systolic Blood Pressure: 108 mmHg     Is BP treated: No     HDL Cholesterol: 60 mg/dL     Total Cholesterol: 207 mg/dL    GERD well controlled with weight loss   This visit occurred during the SARS-CoV-2 public health emergency.  Safety protocols were in place, including screening questions prior to the visit, additional usage of staff PPE, and extensive cleaning of exam room while observing appropriate contact time as indicated for disinfecting solutions.   COVID 19 screen:  No recent travel or known exposure to COVID19 The patient denies respiratory symptoms of COVID 19 at this time. The importance of social distancing was discussed today.     Review of Systems  Constitutional: Negative for chills and fever.  HENT: Negative for congestion and ear pain.   Eyes: Negative for pain and redness.  Respiratory: Negative for cough and shortness of breath.   Cardiovascular: Negative for chest pain, palpitations and leg swelling.  Gastrointestinal: Negative for abdominal pain, blood in stool, constipation,  diarrhea, nausea and vomiting.  Genitourinary: Negative for dysuria.  Musculoskeletal: Negative for falls and myalgias.  Skin: Negative for rash.  Neurological: Negative for dizziness.  Psychiatric/Behavioral: Negative for depression. The patient is not nervous/anxious.       Past Medical History:  Diagnosis Date  . GERD (gastroesophageal reflux disease)     reports that she has quit smoking. She has never used smokeless tobacco. She reports that she does not drink alcohol or use drugs.   Current Outpatient Medications:  .  Ascorbic Acid (VITAMIN C GUMMIE PO), Take 2 each by mouth daily., Disp: , Rfl:  .  Bacillus Coagulans-Inulin (ALIGN PREBIOTIC-PROBIOTIC) 5-1.25 MG-GM CHEW, , Disp: , Rfl:  .  ferrous sulfate 325 (65 FE) MG tablet, Take 1 tablet (325 mg total) by mouth daily with breakfast., Disp: 90 tablet, Rfl: 3   Observations/Objective: Blood pressure 108/78, pulse 79, temperature 97.9 F (36.6 C), temperature source Temporal, height 5' 6.25" (1.683 m), weight 233 lb 4 oz (105.8 kg), last menstrual period 05/09/2019, SpO2 97 %.  Physical Exam Constitutional:      General: She is not in acute distress.    Appearance: Normal appearance. She is well-developed. She is obese. She is not ill-appearing or toxic-appearing.  HENT:     Head: Normocephalic.     Right Ear: Hearing,  tympanic membrane, ear canal and external ear normal.     Left Ear: Hearing, tympanic membrane, ear canal and external ear normal.     Nose: Nose normal.  Eyes:     General: Lids are normal. Lids are everted, no foreign bodies appreciated.     Conjunctiva/sclera: Conjunctivae normal.     Pupils: Pupils are equal, round, and reactive to light.  Neck:     Thyroid: No thyroid mass or thyromegaly.     Vascular: No carotid bruit.     Trachea: Trachea normal.  Cardiovascular:     Rate and Rhythm: Normal rate and regular rhythm.     Heart sounds: Normal heart sounds, S1 normal and S2 normal. No murmur. No  gallop.   Pulmonary:     Effort: Pulmonary effort is normal. No respiratory distress.     Breath sounds: Normal breath sounds. No wheezing, rhonchi or rales.  Abdominal:     General: Bowel sounds are normal. There is no distension or abdominal bruit.     Palpations: Abdomen is soft. There is no fluid wave or mass.     Tenderness: There is no abdominal tenderness. There is no guarding or rebound.     Hernia: No hernia is present.  Musculoskeletal:     Cervical back: Normal range of motion and neck supple.  Lymphadenopathy:     Cervical: No cervical adenopathy.  Skin:    General: Skin is warm and dry.     Findings: No rash.  Neurological:     Mental Status: She is alert.     Cranial Nerves: No cranial nerve deficit.     Sensory: No sensory deficit.  Psychiatric:        Mood and Affect: Mood is not anxious or depressed.        Speech: Speech normal.        Behavior: Behavior normal. Behavior is cooperative.        Judgment: Judgment normal.      Assessment and Plan   The patient's preventative maintenance and recommended screening tests for an annual wellness exam were reviewed in full today. Brought up to date unless services declined.  Counselled on the importance of diet, exercise, and its role in overall health and mortality. The patient's FH and SH was reviewed, including their home life, tobacco status, and drug and alcohol status.   Vaccines: Uptodate with Td. Has gotten 1st COVID19 Pap/DVE:  2019 nml pap, no HPV done.. repeat in 3 years Mammo:  02/2018, low risk.. plan q 2 years. Colon: no early family history of colon cancer.  Smoking Status: former smoker < 10 years, quit 18 years ago ETOH/ drug use: social ETOH ( 2 glasses of wine once a week, No drug use. HIV screen:  done in past, no concern now   Eliezer Lofts, MD

## 2019-05-30 NOTE — Patient Instructions (Addendum)
Continue great work on healthy lifestyle.  Increase physical activity as able.   Preventive Care 79-46 Years Old, Female Preventive care refers to visits with your health care provider and lifestyle choices that can promote health and wellness. This includes:  A yearly physical exam. This may also be called an annual well check.  Regular dental visits and eye exams.  Immunizations.  Screening for certain conditions.  Healthy lifestyle choices, such as eating a healthy diet, getting regular exercise, not using drugs or products that contain nicotine and tobacco, and limiting alcohol use. What can I expect for my preventive care visit? Physical exam Your health care provider will check your:  Height and weight. This may be used to calculate body mass index (BMI), which tells if you are at a healthy weight.  Heart rate and blood pressure.  Skin for abnormal spots. Counseling Your health care provider may ask you questions about your:  Alcohol, tobacco, and drug use.  Emotional well-being.  Home and relationship well-being.  Sexual activity.  Eating habits.  Work and work Statistician.  Method of birth control.  Menstrual cycle.  Pregnancy history. What immunizations do I need?  Influenza (flu) vaccine  This is recommended every year. Tetanus, diphtheria, and pertussis (Tdap) vaccine  You may need a Td booster every 10 years. Varicella (chickenpox) vaccine  You may need this if you have not been vaccinated. Zoster (shingles) vaccine  You may need this after age 19. Measles, mumps, and rubella (MMR) vaccine  You may need at least one dose of MMR if you were born in 1957 or later. You may also need a second dose. Pneumococcal conjugate (PCV13) vaccine  You may need this if you have certain conditions and were not previously vaccinated. Pneumococcal polysaccharide (PPSV23) vaccine  You may need one or two doses if you smoke cigarettes or if you have certain  conditions. Meningococcal conjugate (MenACWY) vaccine  You may need this if you have certain conditions. Hepatitis A vaccine  You may need this if you have certain conditions or if you travel or work in places where you may be exposed to hepatitis A. Hepatitis B vaccine  You may need this if you have certain conditions or if you travel or work in places where you may be exposed to hepatitis B. Haemophilus influenzae type b (Hib) vaccine  You may need this if you have certain conditions. Human papillomavirus (HPV) vaccine  If recommended by your health care provider, you may need three doses over 6 months. You may receive vaccines as individual doses or as more than one vaccine together in one shot (combination vaccines). Talk with your health care provider about the risks and benefits of combination vaccines. What tests do I need? Blood tests  Lipid and cholesterol levels. These may be checked every 5 years, or more frequently if you are over 73 years old.  Hepatitis C test.  Hepatitis B test. Screening  Lung cancer screening. You may have this screening every year starting at age 89 if you have a 30-pack-year history of smoking and currently smoke or have quit within the past 15 years.  Colorectal cancer screening. All adults should have this screening starting at age 80 and continuing until age 27. Your health care provider may recommend screening at age 65 if you are at increased risk. You will have tests every 1-10 years, depending on your results and the type of screening test.  Diabetes screening. This is done by checking your blood sugar (  glucose) after you have not eaten for a while (fasting). You may have this done every 1-3 years.  Mammogram. This may be done every 1-2 years. Talk with your health care provider about when you should start having regular mammograms. This may depend on whether you have a family history of breast cancer.  BRCA-related cancer screening. This  may be done if you have a family history of breast, ovarian, tubal, or peritoneal cancers.  Pelvic exam and Pap test. This may be done every 3 years starting at age 83. Starting at age 25, this may be done every 5 years if you have a Pap test in combination with an HPV test. Other tests  Sexually transmitted disease (STD) testing.  Bone density scan. This is done to screen for osteoporosis. You may have this scan if you are at high risk for osteoporosis. Follow these instructions at home: Eating and drinking  Eat a diet that includes fresh fruits and vegetables, whole grains, lean protein, and low-fat dairy.  Take vitamin and mineral supplements as recommended by your health care provider.  Do not drink alcohol if: ? Your health care provider tells you not to drink. ? You are pregnant, may be pregnant, or are planning to become pregnant.  If you drink alcohol: ? Limit how much you have to 0-1 drink a day. ? Be aware of how much alcohol is in your drink. In the [place] one drink equals one 12 oz bottle of beer (355 mL), one 5 oz glass of wine (148 mL), or one 1 oz glass of hard liquor (44 mL). Lifestyle  Take daily care of your teeth and gums.  Stay active. Exercise for at least 30 minutes on 5 or more days each week.  Do not use any products that contain nicotine or tobacco, such as cigarettes, e-cigarettes, and chewing tobacco. If you need help quitting, ask your health care provider.  If you are sexually active, practice safe sex. Use a condom or other form of birth control (contraception) in order to prevent pregnancy and STIs (sexually transmitted infections).  If told by your health care provider, take low-dose aspirin daily starting at age 61. What's next?  Visit your health care provider once a year for a well check visit.  Ask your health care provider how often you should have your eyes and teeth checked.  Stay up to date on all vaccines. This information is not  intended to replace advice given to you by your health care provider. Make sure you discuss any questions you have with your health care provider. Document Revised: 11/09/2017 Document Reviewed: 11/09/2017 Elsevier Patient Education  2020 [employer].

## 2020-01-11 ENCOUNTER — Ambulatory Visit (INDEPENDENT_AMBULATORY_CARE_PROVIDER_SITE_OTHER): Payer: Managed Care, Other (non HMO)

## 2020-01-11 ENCOUNTER — Other Ambulatory Visit: Payer: Self-pay

## 2020-01-11 DIAGNOSIS — Z23 Encounter for immunization: Secondary | ICD-10-CM

## 2020-05-13 ENCOUNTER — Telehealth: Payer: Self-pay | Admitting: Family Medicine

## 2020-05-13 DIAGNOSIS — Z1159 Encounter for screening for other viral diseases: Secondary | ICD-10-CM

## 2020-05-13 DIAGNOSIS — E6609 Other obesity due to excess calories: Secondary | ICD-10-CM

## 2020-05-13 DIAGNOSIS — D509 Iron deficiency anemia, unspecified: Secondary | ICD-10-CM

## 2020-05-13 NOTE — Telephone Encounter (Signed)
-----   Message from Alvina Chou sent at 05/11/2020 10:43 AM EST ----- Regarding: Lab orders for Tuesday, 3.15.22 Patient is scheduled for CPX labs, please order future labs, Thanks , Camelia Eng

## 2020-05-14 ENCOUNTER — Other Ambulatory Visit: Payer: Self-pay | Admitting: Family Medicine

## 2020-05-14 DIAGNOSIS — D509 Iron deficiency anemia, unspecified: Secondary | ICD-10-CM

## 2020-05-26 ENCOUNTER — Other Ambulatory Visit: Payer: Self-pay

## 2020-05-26 ENCOUNTER — Other Ambulatory Visit (INDEPENDENT_AMBULATORY_CARE_PROVIDER_SITE_OTHER): Payer: Managed Care, Other (non HMO)

## 2020-05-26 DIAGNOSIS — E6609 Other obesity due to excess calories: Secondary | ICD-10-CM

## 2020-05-26 DIAGNOSIS — Z1159 Encounter for screening for other viral diseases: Secondary | ICD-10-CM

## 2020-05-26 DIAGNOSIS — Z6837 Body mass index (BMI) 37.0-37.9, adult: Secondary | ICD-10-CM | POA: Diagnosis not present

## 2020-05-26 DIAGNOSIS — D509 Iron deficiency anemia, unspecified: Secondary | ICD-10-CM

## 2020-05-26 NOTE — Addendum Note (Signed)
Addended by: Alvina Chou on: 05/26/2020 07:52 AM   Modules accepted: Orders

## 2020-05-26 NOTE — Addendum Note (Signed)
Addended by: Darnice Comrie J on: 05/26/2020 07:52 AM   Modules accepted: Orders  

## 2020-05-27 LAB — CBC WITH DIFFERENTIAL/PLATELET
Basophils Absolute: 0.1 10*3/uL (ref 0.0–0.2)
Basos: 1 %
EOS (ABSOLUTE): 0.1 10*3/uL (ref 0.0–0.4)
Eos: 2 %
Hematocrit: 38.6 % (ref 34.0–46.6)
Hemoglobin: 12.6 g/dL (ref 11.1–15.9)
Immature Grans (Abs): 0 10*3/uL (ref 0.0–0.1)
Immature Granulocytes: 0 %
Lymphocytes Absolute: 1.9 10*3/uL (ref 0.7–3.1)
Lymphs: 33 %
MCH: 29 pg (ref 26.6–33.0)
MCHC: 32.6 g/dL (ref 31.5–35.7)
MCV: 89 fL (ref 79–97)
Monocytes Absolute: 0.4 10*3/uL (ref 0.1–0.9)
Monocytes: 6 %
Neutrophils Absolute: 3.5 10*3/uL (ref 1.4–7.0)
Neutrophils: 58 %
Platelets: 306 10*3/uL (ref 150–450)
RBC: 4.35 x10E6/uL (ref 3.77–5.28)
RDW: 12.4 % (ref 11.7–15.4)
WBC: 6 10*3/uL (ref 3.4–10.8)

## 2020-05-27 LAB — COMPREHENSIVE METABOLIC PANEL
ALT: 15 IU/L (ref 0–32)
AST: 9 IU/L (ref 0–40)
Albumin/Globulin Ratio: 1.6 (ref 1.2–2.2)
Albumin: 3.9 g/dL (ref 3.8–4.8)
Alkaline Phosphatase: 88 IU/L (ref 44–121)
BUN/Creatinine Ratio: 12 (ref 9–23)
BUN: 10 mg/dL (ref 6–24)
Bilirubin Total: 0.2 mg/dL (ref 0.0–1.2)
CO2: 23 mmol/L (ref 20–29)
Calcium: 8.8 mg/dL (ref 8.7–10.2)
Chloride: 105 mmol/L (ref 96–106)
Creatinine, Ser: 0.85 mg/dL (ref 0.57–1.00)
Globulin, Total: 2.4 g/dL (ref 1.5–4.5)
Glucose: 90 mg/dL (ref 65–99)
Potassium: 4.3 mmol/L (ref 3.5–5.2)
Sodium: 140 mmol/L (ref 134–144)
Total Protein: 6.3 g/dL (ref 6.0–8.5)
eGFR: 86 mL/min/{1.73_m2} (ref >59)

## 2020-05-27 LAB — HEPATITIS C ANTIBODY: Hep C Virus Ab: <0.1 s/co ratio (ref 0.0–0.9)

## 2020-05-27 LAB — LIPID PANEL
Chol/HDL Ratio: 3.5 ratio (ref 0.0–4.4)
Cholesterol, Total: 202 mg/dL — ABNORMAL HIGH (ref 100–199)
HDL: 57 mg/dL (ref >39)
LDL Chol Calc (NIH): 134 mg/dL — ABNORMAL HIGH (ref 0–99)
Triglycerides: 60 mg/dL (ref 0–149)
VLDL Cholesterol Cal: 11 mg/dL (ref 5–40)

## 2020-05-27 NOTE — Progress Notes (Signed)
No critical labs need to be addressed urgently. We will discuss labs in detail at upcoming office visit.   

## 2020-06-02 ENCOUNTER — Other Ambulatory Visit: Payer: Self-pay

## 2020-06-02 ENCOUNTER — Ambulatory Visit (INDEPENDENT_AMBULATORY_CARE_PROVIDER_SITE_OTHER): Payer: Managed Care, Other (non HMO) | Admitting: Family Medicine

## 2020-06-02 ENCOUNTER — Encounter: Payer: Self-pay | Admitting: Family Medicine

## 2020-06-02 VITALS — BP 120/80 | HR 81 | Temp 98.0°F | Ht 66.0 in | Wt 260.5 lb

## 2020-06-02 DIAGNOSIS — Z6837 Body mass index (BMI) 37.0-37.9, adult: Secondary | ICD-10-CM

## 2020-06-02 DIAGNOSIS — Z0000 Encounter for general adult medical examination without abnormal findings: Secondary | ICD-10-CM | POA: Diagnosis not present

## 2020-06-02 DIAGNOSIS — E6609 Other obesity due to excess calories: Secondary | ICD-10-CM | POA: Diagnosis not present

## 2020-06-02 NOTE — Patient Instructions (Addendum)
Get back to regular exercise and healthy eating.   We will get colonoscopy records from Dr. Servando Snare.  Please call the location of your choice from the menu below to schedule your Mammogram and/or Bone Density appointment.     [place]  1. [facility] at [facility]   Phone:  [phone]   [address]                                                                            [place], [place] [address]                                            Services: 3D Mammogram and Bone Density  2. [facility] at [facility])  Phone:  [phone]   [address]                        [place], [place] [address]                                              Services:  3D Mammogram and Bone Density

## 2020-06-02 NOTE — Progress Notes (Signed)
Patient ID: Tammie Murphy, female    DOB: 1975/09/04, 45 y.o.   MRN: [id number]  This visit was conducted in person.  BP 120/80   Pulse 81   Temp 98 F (36.7 C) (Temporal)   Ht _0  (1.676 m)   Wt 260 lb 8 oz (118.2 kg)   LMP 05/26/2020   SpO2 96%   BMI 42.05 kg/m    CC:  Chief Complaint  Patient presents with  . Annual Exam    Subjective:   HPI: Tammie Murphy is a 45 y.o. female presenting on 06/02/2020 for Annual Exam  Morbid obesity... unfortunately she has gained weight from last year.  She has been snacking more. Sitting more working at home.  Body mass index is 42.05 kg/m. Wt Readings from Last 3 Encounters:  06/02/20 260 lb 8 oz (118.2 kg)  05/30/19 233 lb 4 oz (105.8 kg)  03/05/18 266 lb (120.7 kg)    Reviewed labs in detail. Lab Results  Component Value Date   CHOL 202 (H) 05/26/2020   HDL 57 05/26/2020   LDLCALC 134 (H) 05/26/2020   TRIG 60 05/26/2020   CHOLHDL 3.5 05/26/2020  The 10-year ASCVD risk score Mikey Bussing DC Jr., et al., 2013) is: 0.7%   Values used to calculate the score:     Age: 42 years     Sex: Female     Is Non-Hispanic African American: No     Diabetic: No     Tobacco smoker: No     Systolic Blood Pressure: 606 mmHg     Is BP treated: No     HDL Cholesterol: 57 mg/dL     Total Cholesterol: 202 mg/dL       Relevant past medical, surgical, family and social history reviewed and updated as indicated. Interim medical history since our last visit reviewed. Allergies and medications reviewed and updated. Outpatient Medications Prior to Visit  Medication Sig Dispense Refill  . Ascorbic Acid (VITAMIN C GUMMIE PO) Take 2 each by mouth daily.    . Bacillus Coagulans-Inulin (ALIGN PREBIOTIC-PROBIOTIC) 5-1.25 MG-GM CHEW     . ferrous sulfate 325 (65 FE) MG tablet Take 1 tablet (325 mg total) by mouth daily with breakfast. 90 tablet 3   No facility-administered medications prior to visit.     Per HPI unless specifically  indicated in ROS section below Review of Systems  Constitutional: Negative for fatigue and fever.  HENT: Negative for congestion.   Eyes: Negative for pain.  Respiratory: Negative for cough and shortness of breath.   Cardiovascular: Negative for chest pain, palpitations and leg swelling.  Gastrointestinal: Negative for abdominal pain.  Genitourinary: Negative for dysuria and vaginal bleeding.       Heavy menses   no easy bruising, no nosebleed  Musculoskeletal: Negative for back pain.  Neurological: Negative for syncope, light-headedness and headaches.  Psychiatric/Behavioral: Negative for dysphoric mood.   Objective:  BP 120/80   Pulse 81   Temp 98 F (36.7 C) (Temporal)   Ht _1  (1.676 m)   Wt 260 lb 8 oz (118.2 kg)   LMP 05/26/2020   SpO2 96%   BMI 42.05 kg/m   Wt Readings from Last 3 Encounters:  06/02/20 260 lb 8 oz (118.2 kg)  05/30/19 233 lb 4 oz (105.8 kg)  03/05/18 266 lb (120.7 kg)      Physical Exam Constitutional:      General: She is not in acute distress.    Appearance:  Normal appearance. She is well-developed. She is not ill-appearing or toxic-appearing.  HENT:     Head: Normocephalic.     Right Ear: Hearing, tympanic membrane, ear canal and external ear normal.     Left Ear: Hearing, tympanic membrane, ear canal and external ear normal.     Nose: Nose normal.  Eyes:     General: Lids are normal. Lids are everted, no foreign bodies appreciated.     Conjunctiva/sclera: Conjunctivae normal.     Pupils: Pupils are equal, round, and reactive to light.  Neck:     Thyroid: No thyroid mass or thyromegaly.     Vascular: No carotid bruit.     Trachea: Trachea normal.  Cardiovascular:     Rate and Rhythm: Normal rate and regular rhythm.     Heart sounds: Normal heart sounds, S1 normal and S2 normal. No murmur heard. No gallop.   Pulmonary:     Effort: Pulmonary effort is normal. No respiratory distress.     Breath sounds: Normal breath sounds. No  wheezing, rhonchi or rales.  Abdominal:     General: Bowel sounds are normal. There is no distension or abdominal bruit.     Palpations: Abdomen is soft. There is no fluid wave or mass.     Tenderness: There is no abdominal tenderness. There is no guarding or rebound.     Hernia: No hernia is present.  Musculoskeletal:     Cervical back: Normal range of motion and neck supple.  Lymphadenopathy:     Cervical: No cervical adenopathy.  Skin:    General: Skin is warm and dry.     Findings: No rash.  Neurological:     Mental Status: She is alert.     Cranial Nerves: No cranial nerve deficit.     Sensory: No sensory deficit.  Psychiatric:        Mood and Affect: Mood is not anxious or depressed.        Speech: Speech normal.        Behavior: Behavior normal. Behavior is cooperative.        Judgment: Judgment normal.       Results for orders placed or performed in visit on 05/26/20  Lipid panel  Result Value Ref Range   Cholesterol, Total 202 (H) 100 - 199 mg/dL   Triglycerides 60 0 - 149 mg/dL   HDL 57 >39 mg/dL   VLDL Cholesterol Cal 11 5 - 40 mg/dL   LDL Chol Calc (NIH) 134 (H) 0 - 99 mg/dL   Chol/HDL Ratio 3.5 0.0 - 4.4 ratio  Comprehensive metabolic panel  Result Value Ref Range   Glucose 90 65 - 99 mg/dL   BUN 10 6 - 24 mg/dL   Creatinine, Ser 0.85 0.57 - 1.00 mg/dL   eGFR 86 >59 mL/min/1.73   BUN/Creatinine Ratio 12 9 - 23   Sodium 140 134 - 144 mmol/L   Potassium 4.3 3.5 - 5.2 mmol/L   Chloride 105 96 - 106 mmol/L   CO2 23 20 - 29 mmol/L   Calcium 8.8 8.7 - 10.2 mg/dL   Total Protein 6.3 6.0 - 8.5 g/dL   Albumin 3.9 3.8 - 4.8 g/dL   Globulin, Total 2.4 1.5 - 4.5 g/dL   Albumin/Globulin Ratio 1.6 1.2 - 2.2   Bilirubin Total 0.2 0.0 - 1.2 mg/dL   Alkaline Phosphatase 88 44 - 121 IU/L   AST 9 0 - 40 IU/L   ALT 15 0 - 32 IU/L  Hepatitis C antibody  Result Value Ref Range   Hep C Virus Ab <0.1 0.0 - 0.9 s/co ratio  CBC with Differential/Platelet  Result Value Ref  Range   WBC 6.0 3.4 - 10.8 x10E3/uL   RBC 4.35 3.77 - 5.28 x10E6/uL   Hemoglobin 12.6 11.1 - 15.9 g/dL   Hematocrit 38.6 34.0 - 46.6 %   MCV 89 79 - 97 fL   MCH 29.0 26.6 - 33.0 pg   MCHC 32.6 31.5 - 35.7 g/dL   RDW 12.4 11.7 - 15.4 %   Platelets 306 150 - 450 x10E3/uL   Neutrophils 58 Not Estab. %   Lymphs 33 Not Estab. %   Monocytes 6 Not Estab. %   Eos 2 Not Estab. %   Basos 1 Not Estab. %   Neutrophils Absolute 3.5 1.4 - 7.0 x10E3/uL   Lymphocytes Absolute 1.9 0.7 - 3.1 x10E3/uL   Monocytes Absolute 0.4 0.1 - 0.9 x10E3/uL   EOS (ABSOLUTE) 0.1 0.0 - 0.4 x10E3/uL   Basophils Absolute 0.1 0.0 - 0.2 x10E3/uL   Immature Granulocytes 0 Not Estab. %   Immature Grans (Abs) 0.0 0.0 - 0.1 x10E3/uL    This visit occurred during the SARS-CoV-2 public health emergency.  Safety protocols were in place, including screening questions prior to the visit, additional usage of staff PPE, and extensive cleaning of exam room while observing appropriate contact time as indicated for disinfecting solutions.   COVID 19 screen:  No recent travel or known exposure to COVID19 The patient denies respiratory symptoms of COVID 19 at this time. The importance of social distancing was discussed today.   Assessment and Plan   The patient's preventative maintenance and recommended screening tests for an annual wellness exam were reviewed in full today. Brought up to date unless services declined.  Counselled on the importance of diet, exercise, and its role in overall health and mortality. The patient's FH and SH was reviewed, including their home life, tobacco status, and drug and alcohol status.   Vaccines:Uptodate with Td. Has gotten  COVID19 vaccine x 3 Pap/DVE:2019 nml pap, no HPV done.. repeat in 3 years.Marland Kitchen [id number] Mammo:02/2018, low risk.. plan q 2 years... due now Colon:no early family history of colon cancer.. Dr. Allen Norris.. about 5 years ago. Smoking Status: former smoker < 10 years, quit 18  years ago ETOH/ drug JSE:GBTDVV ETOH ( 2 glasses of wine once a week, No drug use. HIV screen:done in past, no concern now Hep C done  Counseled on healthy eating, regular exercise and weight loss for obesity management. Eliezer Lofts, MD

## 2020-06-05 ENCOUNTER — Other Ambulatory Visit: Payer: Self-pay | Admitting: Family Medicine

## 2020-06-05 DIAGNOSIS — Z1231 Encounter for screening mammogram for malignant neoplasm of breast: Secondary | ICD-10-CM

## 2020-07-15 ENCOUNTER — Ambulatory Visit
Admission: RE | Admit: 2020-07-15 | Discharge: 2020-07-15 | Disposition: A | Discharge status: Home / Self Care | Payer: Managed Care, Other (non HMO) | Source: Ambulatory Visit | Attending: Family Medicine | Admitting: Family Medicine

## 2020-07-15 ENCOUNTER — Other Ambulatory Visit: Payer: Self-pay

## 2020-07-15 DIAGNOSIS — Z1231 Encounter for screening mammogram for malignant neoplasm of breast: Secondary | ICD-10-CM | POA: Diagnosis present

## 2020-08-07 ENCOUNTER — Encounter: Payer: Self-pay | Admitting: Obstetrics

## 2020-08-07 ENCOUNTER — Ambulatory Visit: Payer: Managed Care, Other (non HMO) | Admitting: Obstetrics

## 2020-08-07 ENCOUNTER — Other Ambulatory Visit: Payer: Self-pay

## 2020-08-07 VITALS — BP 128/70 | Ht 66.0 in | Wt 263.8 lb

## 2020-08-07 DIAGNOSIS — N921 Excessive and frequent menstruation with irregular cycle: Secondary | ICD-10-CM

## 2020-08-07 MED ORDER — NORGESTIMATE-ETH ESTRADIOL 0.25-35 MG-MCG PO TABS: 1.0000 | ORAL_TABLET | Freq: Every day | ORAL | 1 refills | Status: DC

## 2020-08-07 NOTE — Progress Notes (Signed)
Obstetrics & Gynecology Office Visit   Chief Complaint:  Chief Complaint  Patient presents with  . Gynecologic Exam    History of Present Illness: Tammie Murphy presents today to discuss heavy periods. She has struggled with metrorrhagia for a number of years. She has regularperiods, q 28 days, with 7 full days of bleeding. Her first several days each month are very heavy, and she dscribes bleeding through her pads and clothing on a regular basis. She has had a tubal ligation and thus does not use hormonal contraception.She describes passing large clots as well. Tammie Murphy is interested in a treatment plan She denies any hx of DVTs, migraines,or tobacco use. She does not have HTN.  She recently had an annual Well Woman exam and was placed on daily iron tabs due to anemia.  Her pelvic exam was normal per her PCP. She is not due for a Pap smear.   Review of Systems:  Review of Systems  Constitutional: Negative.   Eyes: Negative.   Respiratory: Negative.   Cardiovascular: Negative.   Gastrointestinal: Negative.   Genitourinary: Negative.   Musculoskeletal: Negative.   Skin: Negative.   Neurological: Negative.   Endo/Heme/Allergies:       Heavy periods each month  Psychiatric/Behavioral: Negative.      Past Medical History:  Past Medical History:  Diagnosis Date  . GERD (gastroesophageal reflux disease)     Past Surgical History:  Past Surgical History:  Procedure Laterality Date  . TUBAL LIGATION    . WISDOM TOOTH EXTRACTION      Gynecologic History: Patient's last menstrual period was 07/19/2020.  Obstetric History: No obstetric history on file.  Family History:  Family History  Problem Relation Age of Onset  . Hypertension Mother   . Alcohol abuse Father   . AAA (abdominal aortic aneurysm) Father   . Hypertension Father   . AAA (abdominal aortic aneurysm) Maternal Grandmother   . Hypertension Maternal Grandfather   . Heart attack Maternal Grandfather   . Heart  disease Maternal Grandfather   . AAA (abdominal aortic aneurysm) Paternal Grandmother   . Alzheimer's disease Paternal Grandmother   . Prostate cancer Paternal Grandfather   . Heart disease Paternal Grandfather   . Breast cancer Neg Hx     Social History:  Social History   Socioeconomic History  . Marital status: Married    Spouse name: Onalee Hua  . Number of children: 1  . Years of education: Not on file  . Highest education level: Not on file  Occupational History  . Not on file  Tobacco Use  . Smoking status: Former Games developer  . Smokeless tobacco: Never Used  Substance and Sexual Activity  . Alcohol use: No  . Drug use: No  . Sexual activity: Yes  Other Topics Concern  . Not on file  Social History Narrative  . Not on file   Social Determinants of Health   Financial Resource Strain: Not on file  Food Insecurity: Not on file  Transportation Needs: Not on file  Physical Activity: Not on file  Stress: Not on file  Social Connections: Not on file  Intimate Partner Violence: Not on file    Allergies:  Allergies  Allergen Reactions  . Flonase [Fluticasone Propionate] Anaphylaxis    Medications: Prior to Admission medications   Medication Sig Start Date End Date Taking? Authorizing Provider  Ascorbic Acid (VITAMIN C GUMMIE PO) Take 2 each by mouth daily.   Yes [provider]  Bacillus Coagulans-Inulin (ALIGN  PREBIOTIC-PROBIOTIC) 5-1.25 MG-GM CHEW    Yes [provider]  ferrous sulfate 325 (65 FE) MG tablet Take 1 tablet (325 mg total) by mouth daily with breakfast. 12/23/15  Yes Dianne Dun, MD  norgestimate-ethinyl estradiol (ORTHO-CYCLEN) 0.25-35 MG-MCG tablet Take 1 tablet by mouth daily. 08/07/20  Yes Mirna Mires, CNM    Physical Exam Vitals:  Vitals:   08/07/20 0941  BP: 128/70   Patient's last menstrual period was 07/19/2020.  Physical Exam Constitutional:      Appearance: Normal appearance. She is obese.  HENT:     Head:  Normocephalic and atraumatic.  Cardiovascular:     Rate and Rhythm: Normal rate and regular rhythm.  Pulmonary:     Effort: Pulmonary effort is normal.     Breath sounds: Normal breath sounds.  Genitourinary:    Comments: deferred Musculoskeletal:        General: Normal range of motion.     Cervical back: Normal range of motion and neck supple.  Skin:    General: Skin is warm and dry.  Neurological:     General: No focal deficit present.     Mental Status: She is alert and oriented to person, place, and time.  Psychiatric:        Mood and Affect: Mood normal.        Behavior: Behavior normal.      Assessment: 45 y.o. No obstetric history on file. Metrorrhagia. History of BTL   Plan: Problem List Items Addressed This Visit   None   Visit Diagnoses    Metrorrhagia    -  Primary   Relevant Medications   norgestimate-ethinyl estradiol (ORTHO-CYCLEN) 0.25-35 MG-MCG tablet     Discussed options for treatment, including a trial of OCPs and consultation with an MD for an endometrial ablation.  She is interested in ablation. Will start her now on daily sprintec, and she will make an appointment with Dr. Jean Rosenthal to discuss ablation. Careful counsel provided on the use of her OCPs- to take daily, and report should she develop any headaches, elevated Bps, migraines or localized leg pain.  Information ofn ablation is provided today.  Mirna Mires, CNM  08/07/2020 11:30 AM

## 2020-08-07 NOTE — Patient Instructions (Signed)
Endometrial Ablation Endometrial ablation is a procedure that destroys the thin inner layer of the lining of the uterus (endometrium). This procedure may be done:  To stop heavy menstrual periods.  To stop bleeding that is causing anemia.  To control irregular bleeding.  To treat bleeding caused by small tumors (fibroids) in the endometrium. This procedure is often done as an alternative to major surgery, such as removal of the uterus and cervix (hysterectomy). As a result of this procedure:  You may not be able to have children. However, if you have not yet gone through menopause: ? You may still have a small chance of getting pregnant. ? You will need to use a reliable method of birth control after the procedure to prevent pregnancy.  You may stop having a menstrual period, or you may have only a small amount of bleeding during your period. Menstruation may return several years after the procedure. Tell a health care provider about:  Any allergies you have.  All medicines you are taking, including vitamins, herbs, eye drops, creams, and over-the-counter medicines.  Any problems you or family members have had with the use of anesthetic medicines.  Any blood disorders you have.  Any surgeries you have had.  Any medical conditions you have.  Whether you are pregnant or may be pregnant. What are the risks? Generally, this is a safe procedure. However, problems may occur, including:  A hole (perforation) in the uterus or bowel.  Infection in the uterus, bladder, or vagina.  Bleeding.  Allergic reaction to medicines.  Damage to nearby structures or organs.  An air bubble in the lung (air embolus).  Problems with pregnancy.  Failure of the procedure.  Decreased ability to diagnose cancer in the endometrium. Scar tissue forms after the procedure, making it more difficult to get a sample of the uterine lining. What happens before the procedure? Medicines Ask your health  care provider about:  Changing or stopping your regular medicines. This is especially important if you take diabetes medicines or blood thinners.  Taking medicines such as aspirin and ibuprofen. These medicines can thin your blood. Do not take these medicines before your procedure if your doctor tells you not to take them.  Taking over-the-counter medicines, vitamins, herbs, and supplements. Tests  You will have tests of your endometrium to make sure there are no precancerous cells or cancer cells present.  You may have an ultrasound of the uterus. General instructions  Do not use any products that contain nicotine or tobacco for at least 4 weeks before the procedure. These include cigarettes, chewing tobacco, and vaping devices, such as e-cigarettes. If you need help quitting, ask your health care provider.  You may be given medicines to thin the endometrium.  Ask your health care provider what steps will be taken to help prevent infection. These steps may include: ? Removing hair at the surgery site. ? Washing skin with a germ-killing soap. ? Taking antibiotic medicine.  Plan to have a responsible adult take you home from the hospital or clinic.  Plan to have a responsible adult care for you for the time you are told after you leave the hospital or clinic. This is important. What happens during the procedure?  You will lie on an exam table with your feet and legs supported as in a pelvic exam.  An IV will be inserted into one of your veins.  You will be given a medicine to help you relax (sedative).  A surgical tool with   a light and camera (resectoscope) will be inserted into your vagina and moved into your uterus. This allows your surgeon to see inside your uterus.  Endometrial tissue will be destroyed and removed, using one of the following methods: ? Radiofrequency. This uses an electrical current to destroy the endometrium. ? Cryotherapy. This uses extreme cold to freeze  the endometrium. ? Heated fluid. This uses a heated salt and water (saline) solution to destroy the endometrium. ? Microwave. This uses high-energy microwaves to heat up the endometrium and destroy it. ? Thermal balloon. This involves inserting a catheter with a balloon tip into the uterus. The balloon tip is filled with heated fluid to destroy the endometrium. The procedure may vary among health care providers and hospitals.   What happens after the procedure?  Your blood pressure, heart rate, breathing rate, and blood oxygen level will be monitored until you leave the hospital or clinic.  You may have vaginal bleeding for 4-6 weeks after the procedure. You may also have: ? Cramps. ? A thin, watery vaginal discharge that is light pink or brown. ? A need to urinate more than usual. ? Nausea.  If you were given a sedative during the procedure, it can affect you for several hours. Do not drive or operate machinery until your health care provider says that it is safe.  Do not have sex or insert anything into your vagina until your health care provider says it is safe. Summary  Endometrial ablation is done to treat many causes of heavy menstrual bleeding. The procedure destroys the thin inner layer of the lining of the uterus (endometrium).  This procedure is often done as an alternative to major surgery, such as removal of the uterus and cervix (hysterectomy).  Plan to have a responsible adult take you home from the hospital or clinic. This information is not intended to replace advice given to you by your health care provider. Make sure you discuss any questions you have with your health care provider. Document Revised: 09/19/2019 Document Reviewed: 09/19/2019 Elsevier Patient Education  2021 [employer]       

## 2020-08-28 ENCOUNTER — Ambulatory Visit (INDEPENDENT_AMBULATORY_CARE_PROVIDER_SITE_OTHER): Payer: Managed Care, Other (non HMO) | Admitting: Obstetrics and Gynecology

## 2020-08-28 ENCOUNTER — Encounter: Payer: Self-pay | Admitting: Obstetrics and Gynecology

## 2020-08-28 ENCOUNTER — Other Ambulatory Visit: Payer: Self-pay

## 2020-08-28 VITALS — BP 124/80 | Ht 66.0 in | Wt 266.0 lb

## 2020-08-28 DIAGNOSIS — N920 Excessive and frequent menstruation with regular cycle: Secondary | ICD-10-CM

## 2020-08-28 DIAGNOSIS — D500 Iron deficiency anemia secondary to blood loss (chronic): Secondary | ICD-10-CM | POA: Diagnosis not present

## 2020-08-28 NOTE — Progress Notes (Signed)
Obstetrics & Gynecology Office Visit   Chief Complaint  Patient presents with   Consultation    Ablation    History of Present Illness: 67 y.A.[id number] female who has a long history since 2016-2017 of very heavy periods. She has to wear an overnight pad nearly constantly.  She soils her clothes sometimes.  She has to be close to a bathroom many times.  She has been on dialy iron likely due to chronic blood loss anemia.    For 2-3 days she goes through an overnight pad after an hour or two, then gets a break, then another heavy day, then a few days of spotting. She has large clots.  She has not taken pills for treatment. She has not tried hormonal treatments.   She has regular periods, q 28 days, with 7 full days of bleeding. Her first several days each month are very heavy.  She was prescribed birth control pills and has not started taking them.    Last pap smear: 02/2018: NILM (no HPV), pap in 2016 also NILM, no HPV testing.  Past Medical History:  Diagnosis Date   GERD (gastroesophageal reflux disease)     Past Surgical History:  Procedure Laterality Date   DILATION AND CURETTAGE OF UTERUS N/A 2000   after miscarriage   TUBAL LIGATION     WISDOM TOOTH EXTRACTION      Gynecologic History: Patient's last menstrual period was 08/22/2020 (exact date).  Obstetric History: [id number],   Family History  Problem Relation Age of Onset   Hypertension Mother    Alcohol abuse Father    AAA (abdominal aortic aneurysm) Father    Hypertension Father    AAA (abdominal aortic aneurysm) Maternal Grandmother    Hypertension Maternal Grandfather    Heart attack Maternal Grandfather    Heart disease Maternal Grandfather    AAA (abdominal aortic aneurysm) Paternal Grandmother    Alzheimer's disease Paternal Grandmother    Prostate cancer Paternal Grandfather    Heart disease Paternal Grandfather    Breast cancer Neg Hx     Social History   Socioeconomic History   Marital status:  Married    Spouse name: Onalee Hua   Number of children: 1   Years of education: Not on file   Highest education level: Not on file  Occupational History   Not on file  Tobacco Use   Smoking status: Former    Pack years: 0.00   Smokeless tobacco: Never  Vaping Use   Vaping Use: Never used  Substance and Sexual Activity   Alcohol use: Yes    Comment: social   Drug use: No   Sexual activity: Yes    Birth control/protection: Surgical  Other Topics Concern   Not on file  Social History Narrative   Not on file   Social Determinants of Health   Financial Resource Strain: Not on file  Food Insecurity: Not on file  Transportation Needs: Not on file  Physical Activity: Not on file  Stress: Not on file  Social Connections: Not on file  Intimate Partner Violence: Not on file    Allergies  Allergen Reactions   Flonase [Fluticasone Propionate] Anaphylaxis    Prior to Admission medications   Medication Sig Start Date End Date Taking? Authorizing Provider  Ascorbic Acid (VITAMIN C GUMMIE PO) Take 2 each by mouth daily.   Yes [provider]  Bacillus Coagulans-Inulin (ALIGN PREBIOTIC-PROBIOTIC) 5-1.25 MG-GM CHEW    Yes [provider]  ferrous sulfate 325 (65  FE) MG tablet Take 1 tablet (325 mg total) by mouth daily with breakfast. 12/23/15  Yes Dianne Dun, MD    Review of Systems  Constitutional: Negative.   HENT: Negative.    Eyes: Negative.   Respiratory: Negative.    Cardiovascular: Negative.   Gastrointestinal: Negative.   Genitourinary: Negative.   Musculoskeletal: Negative.   Skin: Negative.   Neurological: Negative.   Psychiatric/Behavioral: Negative.      Physical Exam BP 124/80   Ht 5\' 6"  (1.676 m)   Wt 266 lb (120.7 kg)   LMP 08/22/2020 (Exact Date)   BMI 42.93 kg/m  Patient's last menstrual period was 08/22/2020 (exact date). Physical Exam Constitutional:      General: She is not in acute distress.    Appearance: Normal appearance.  She is well-developed.  Genitourinary:     Vulva, bladder and urethral meatus normal.     No lesions in the vagina.     Right Labia: No rash, tenderness, lesions, skin changes or Bartholin's cyst.    Left Labia: No tenderness, lesions, skin changes, Bartholin's cyst or rash.    No inguinal adenopathy present in the right or left side.    Pelvic Tanner Score: 5/5.    No vaginal discharge, erythema, tenderness or bleeding.      Right Adnexa: not tender, not full and no mass present.    Left Adnexa: not tender, not full and no mass present.    No cervical motion tenderness, friability, lesion or polyp.     Uterus is not enlarged, fixed or tender.     Uterus is anteverted.     No urethral tenderness or mass present.     Pelvic exam was performed with patient in the lithotomy position.  HENT:     Head: Normocephalic and atraumatic.  Eyes:     General: No scleral icterus.    Conjunctiva/sclera: Conjunctivae normal.  Cardiovascular:     Rate and Rhythm: Normal rate and regular rhythm.     Heart sounds: No murmur heard.   No friction rub. No gallop.  Pulmonary:     Effort: Pulmonary effort is normal. No respiratory distress.     Breath sounds: Normal breath sounds. No wheezing or rales.  Abdominal:     General: Bowel sounds are normal. There is no distension.     Palpations: Abdomen is soft. There is no mass.     Tenderness: There is no abdominal tenderness. There is no guarding or rebound.     Hernia: There is no hernia in the left inguinal area or right inguinal area.  Musculoskeletal:        General: Normal range of motion.     Cervical back: Normal range of motion and neck supple.  Lymphadenopathy:     Lower Body: No right inguinal adenopathy. No left inguinal adenopathy.  Neurological:     General: No focal deficit present.     Mental Status: She is alert and oriented to person, place, and time.     Cranial Nerves: No cranial nerve deficit.  Skin:    General: Skin is warm and  dry.     Findings: No erythema.  Psychiatric:        Mood and Affect: Mood normal.        Behavior: Behavior normal.        Judgment: Judgment normal.   Endometrial Biopsy After discussion with the patient regarding her abnormal uterine bleeding I recommended that she proceed with an endometrial  biopsy for further diagnosis. The risks, benefits, alternatives, and indications for an endometrial biopsy were discussed with the patient in detail. She understood the risks including infection, bleeding, cervical laceration and uterine perforation.  Verbal consent was obtained.   PROCEDURE NOTE:  Pipelle endometrial biopsy was performed using aseptic technique with iodine preparation.  The uterus was sounded to a length of 8 cm.  Adequate sampling was obtained with minimal blood loss.  The patient tolerated the procedure well.  Disposition will be pending pathology.  Female chaperone present for pelvic and breast  portions of the physical exam  Assessment: 45 y.o. No obstetric history on file. female here for  1. Menorrhagia with regular cycle   2. Iron deficiency anemia due to chronic blood loss      Plan: Problem List Items Addressed This Visit       Other   Anemia   Relevant Orders   US PELVIC COMPLETE WITH TRANSVAGINAL   Other Visit Diagnoses     Menorrhagia with regular cycle    -  Primary   Relevant Orders   IGP, Aptima HPV, rfx 16/18,45   Anatomic Pathology Report   US PELVIC COMPLETE WITH TRANSVAGINAL      Discussed management options for abnormal uterine bleeding including expectant, NSAIDs, tranexamic acid (Lysteda), oral progesterone (Provera, norethindrone, megace), Depo Provera, Levonorgestrel containing IUD, endometrial ablation (Novasure) or hysterectomy as definitive surgical management.  Discussed risks and benefits of each method.   Final management decision will hinge on results of patient's work up and whether an underlying etiology for the patients bleeding  symptoms can be discerned.  We will conduct a basic work up examining using the PALM-COIEN classification system.  In the meantime the patient opts to trial cOCs while we await results of her ultrasound and labs.  Bleeding precautions reviewed.   Pap smear with HPV obtained today. Endometrial biopsy obtained. She will start taking combined OPCs as of today in order to decrease her bleeding volume.  Pelvic ultrasound ordered.   A total of 29 minutes were spent face-to-face with the patient as well as preparation, review, communication, and documentation during this encounter.    Thomasene Mohair, MD 08/28/2020 9:50 AM

## 2020-08-29 ENCOUNTER — Encounter: Payer: Self-pay | Admitting: Obstetrics and Gynecology

## 2020-09-01 LAB — ANATOMIC PATHOLOGY REPORT

## 2020-09-01 LAB — IGP, APTIMA HPV, RFX 16/18,45: HPV Aptima: NEGATIVE

## 2020-09-23 ENCOUNTER — Ambulatory Visit: Payer: Managed Care, Other (non HMO)

## 2020-10-12 ENCOUNTER — Ambulatory Visit
Admission: RE | Admit: 2020-10-12 | Discharge: 2020-10-12 | Disposition: A | Discharge status: Home / Self Care | Payer: Managed Care, Other (non HMO) | Source: Ambulatory Visit | Attending: Obstetrics and Gynecology | Admitting: Obstetrics and Gynecology

## 2020-10-12 ENCOUNTER — Other Ambulatory Visit: Payer: Self-pay

## 2020-10-12 DIAGNOSIS — D500 Iron deficiency anemia secondary to blood loss (chronic): Secondary | ICD-10-CM

## 2020-10-12 DIAGNOSIS — N920 Excessive and frequent menstruation with regular cycle: Secondary | ICD-10-CM

## 2020-10-15 ENCOUNTER — Ambulatory Visit: Payer: Managed Care, Other (non HMO) | Admitting: Obstetrics and Gynecology

## 2020-11-05 ENCOUNTER — Encounter: Payer: Self-pay | Admitting: Obstetrics and Gynecology

## 2020-11-05 ENCOUNTER — Ambulatory Visit: Payer: Managed Care, Other (non HMO) | Admitting: Obstetrics and Gynecology

## 2020-11-05 ENCOUNTER — Other Ambulatory Visit: Payer: Self-pay

## 2020-11-05 VITALS — BP 144/83 | Ht 66.0 in | Wt 264.0 lb

## 2020-11-05 DIAGNOSIS — N920 Excessive and frequent menstruation with regular cycle: Secondary | ICD-10-CM | POA: Diagnosis not present

## 2020-11-05 DIAGNOSIS — N921 Excessive and frequent menstruation with irregular cycle: Secondary | ICD-10-CM | POA: Diagnosis not present

## 2020-11-05 MED ORDER — NORGESTIMATE-ETH ESTRADIOL 0.25-35 MG-MCG PO TABS: 1.0000 | ORAL_TABLET | Freq: Every day | ORAL | 4 refills | Status: DC

## 2020-11-05 NOTE — Progress Notes (Signed)
Obstetrics & Gynecology Office Visit    Chief Complaint  Patient presents with   Follow-up  Heavy menstrual bleeding  History of Present Illness: 45 y.o. female who presents in follow up for heavy menstrual bleeding. She has started taking pills.   She has been unable to afford the hospital based ultrasound ($1,200).  Pap smear 08/2020: NILM, HPV negative Endometrial biopsy: negative  She states that she is doing well with the pills, at least for the last cycle things are much better.    Past Medical History:  Diagnosis Date   GERD (gastroesophageal reflux disease)     Past Surgical History:  Procedure Laterality Date   DILATION AND CURETTAGE OF UTERUS N/A 2000   after miscarriage   TUBAL LIGATION     WISDOM TOOTH EXTRACTION      Gynecologic History: Patient's last menstrual period was 10/22/2020.  Obstetric History: No obstetric history on file.  Family History  Problem Relation Age of Onset   Hypertension Mother    Alcohol abuse Father    AAA (abdominal aortic aneurysm) Father    Hypertension Father    AAA (abdominal aortic aneurysm) Maternal Grandmother    Hypertension Maternal Grandfather    Heart attack Maternal Grandfather    Heart disease Maternal Grandfather    AAA (abdominal aortic aneurysm) Paternal Grandmother    Alzheimer's disease Paternal Grandmother    Prostate cancer Paternal Grandfather    Heart disease Paternal Grandfather    Breast cancer Neg Hx     Social History   Socioeconomic History   Marital status: Married    Spouse name: Onalee Hua   Number of children: 1   Years of education: Not on file   Highest education level: Not on file  Occupational History   Not on file  Tobacco Use   Smoking status: Former   Smokeless tobacco: Never  Building services engineer Use: Never used  Substance and Sexual Activity   Alcohol use: Yes    Comment: social   Drug use: No   Sexual activity: Yes    Birth control/protection: Surgical  Other Topics  Concern   Not on file  Social History Narrative   Not on file   Social Determinants of Health   Financial Resource Strain: Not on file  Food Insecurity: Not on file  Transportation Needs: Not on file  Physical Activity: Not on file  Stress: Not on file  Social Connections: Not on file  Intimate Partner Violence: Not on file    Allergies  Allergen Reactions   Flonase [Fluticasone Propionate] Anaphylaxis    Prior to Admission medications   Medication Sig Start Date End Date Taking? Authorizing Provider  Ascorbic Acid (VITAMIN C GUMMIE PO) Take 2 each by mouth daily.   Yes [provider]  Bacillus Coagulans-Inulin (ALIGN PREBIOTIC-PROBIOTIC) 5-1.25 MG-GM CHEW    Yes [provider]  ferrous sulfate 325 (65 FE) MG tablet Take 1 tablet (325 mg total) by mouth daily with breakfast. 12/23/15  Yes Dianne Dun, MD  norgestimate-ethinyl estradiol (ORTHO-CYCLEN) 0.25-35 MG-MCG tablet Take 1 tablet by mouth daily. 08/07/20  Yes Mirna Mires, CNM    Review of Systems  Constitutional: Negative.   HENT: Negative.    Eyes: Negative.   Respiratory: Negative.    Cardiovascular: Negative.   Gastrointestinal: Negative.   Genitourinary: Negative.   Musculoskeletal: Negative.   Skin: Negative.   Neurological: Negative.   Psychiatric/Behavioral: Negative.      Physical Exam BP Marland Kitchen)  144/83   Ht 5\' 6"  (1.676 m)   Wt 264 lb (119.7 kg)   LMP 10/22/2020   BMI 42.61 kg/m  Patient's last menstrual period was 10/22/2020. Physical Exam Constitutional:      General: She is not in acute distress.    Appearance: Normal appearance.  HENT:     Head: Normocephalic and atraumatic.  Eyes:     General: No scleral icterus.    Conjunctiva/sclera: Conjunctivae normal.  Neurological:     General: No focal deficit present.     Mental Status: She is alert and oriented to person, place, and time.     Cranial Nerves: No cranial nerve deficit.  Psychiatric:        Mood and  Affect: Mood normal.        Behavior: Behavior normal.        Judgment: Judgment normal.   Female chaperone present for pelvic and breast  portions of the physical exam  See results from ultrasound report note.   Assessment: 45 y.o. No obstetric history on file. female here for  1. Menorrhagia with regular cycle   2. Metrorrhagia      Plan: Problem List Items Addressed This Visit   None Visit Diagnoses     Menorrhagia with regular cycle    -  Primary   Metrorrhagia       Relevant Medications   norgestimate-ethinyl estradiol (ORTHO-CYCLEN) 0.25-35 MG-MCG tablet      All treatment options discussed. Will use pills for now. 6 month f/u for bilat ovarian cysts with ultrasound  A total of 24 minutes were spent face-to-face with the patient as well as preparation, review, communication, and documentation during this encounter.  This is in addition to time spent doing the ultrasound.  11-28-1998, MD 11/05/2020 2:56 PM

## 2020-11-05 NOTE — Progress Notes (Signed)
ULTRASOUND REPORT  Location: [facility]  Date of Service: 11/05/2020   Indications:Abnormal Uterine Bleeding (transvaginal)  Findings:  The uterus is anteverted. Echo texture is heterogenous with unclear evidence of focal masses with possible adenomyosis, adenomyomas, versus smaller fibroids.  The Endometrium measures 3.5 mm, though difficult to visualize due to endometrial interface with myometrium.   Right Ovary measures has a simple appearing cyst measuring 2.85 x 2.76 x 2.16 cm. Left Ovary measures has two simple appearing cysts measuring 2.64 x 2.08 x 2.29 cm and the second measuring 1.96 x 1.44 x 1.51 cm. There is no free fluid in the cul de sac.  Female chaperone present for pelvic ultrasound:   Impression: 1. Mildly enlarged uterus with heterogenous echotexture to the myometrium with indistinct interface between myometrium and endometrium possibly suggestive of adenomyosis. Other considerations would be adenomyomas and multiple small fibroids.  2. Bilateral simple-appearing ovarian cysts with measurements as noted above.   Recommendations: 1.Clinical correlation with the patient's History and Physical Exam. 2. Follow up in 6 months or as indicated.   I personally performed the ultrasound and interpreted the images.    Thomasene Mohair, MD, [facility], [facility] 11/05/2020 5:48 PM

## 2021-01-08 ENCOUNTER — Ambulatory Visit (INDEPENDENT_AMBULATORY_CARE_PROVIDER_SITE_OTHER): Payer: Managed Care, Other (non HMO)

## 2021-01-08 ENCOUNTER — Other Ambulatory Visit: Payer: Self-pay

## 2021-01-08 DIAGNOSIS — Z23 Encounter for immunization: Secondary | ICD-10-CM

## 2021-02-10 ENCOUNTER — Other Ambulatory Visit: Payer: Self-pay | Admitting: Obstetrics

## 2021-02-10 DIAGNOSIS — N921 Excessive and frequent menstruation with irregular cycle: Secondary | ICD-10-CM

## 2021-03-08 ENCOUNTER — Ambulatory Visit: Admit: 2021-03-08 | Discharge status: Against Medical Advice | Payer: Managed Care, Other (non HMO)

## 2021-05-19 ENCOUNTER — Telehealth: Payer: Self-pay | Admitting: Family Medicine

## 2021-05-19 DIAGNOSIS — Z13220 Encounter for screening for lipoid disorders: Secondary | ICD-10-CM

## 2021-05-19 DIAGNOSIS — D509 Iron deficiency anemia, unspecified: Secondary | ICD-10-CM

## 2021-05-19 NOTE — Telephone Encounter (Signed)
-----   Message from Alvina Chou sent at 05/18/2021 11:17 AM EST ----- ?Regarding: Lab orders for Thursday, 3.16.23 ?Patient is scheduled for CPX labs, please order future labs, Thanks , Terri ? ? ?

## 2021-05-27 ENCOUNTER — Other Ambulatory Visit: Payer: Managed Care, Other (non HMO)

## 2021-05-31 ENCOUNTER — Other Ambulatory Visit (INDEPENDENT_AMBULATORY_CARE_PROVIDER_SITE_OTHER): Payer: Managed Care, Other (non HMO)

## 2021-05-31 ENCOUNTER — Other Ambulatory Visit: Payer: Self-pay

## 2021-05-31 DIAGNOSIS — Z13220 Encounter for screening for lipoid disorders: Secondary | ICD-10-CM | POA: Diagnosis not present

## 2021-05-31 DIAGNOSIS — D509 Iron deficiency anemia, unspecified: Secondary | ICD-10-CM

## 2021-05-31 NOTE — Addendum Note (Signed)
Addended by: Tammi Sou on: 05/31/2021 08:38 AM ? ? Modules accepted: Orders ? ?

## 2021-05-31 NOTE — Addendum Note (Signed)
Addended by: Kenaz Olafson M on: 05/31/2021 08:38 AM ? ? Modules accepted: Orders ? ?

## 2021-06-01 LAB — COMPREHENSIVE METABOLIC PANEL
ALT: 15 IU/L (ref 0–32)
AST: 15 IU/L (ref 0–40)
Albumin/Globulin Ratio: 1.3 (ref 1.2–2.2)
Albumin: 3.7 g/dL — ABNORMAL LOW (ref 3.8–4.8)
Alkaline Phosphatase: 79 IU/L (ref 44–121)
BUN/Creatinine Ratio: 13 (ref 9–23)
BUN: 10 mg/dL (ref 6–24)
Bilirubin Total: 0.2 mg/dL (ref 0.0–1.2)
CO2: 25 mmol/L (ref 20–29)
Calcium: 9.1 mg/dL (ref 8.7–10.2)
Chloride: 101 mmol/L (ref 96–106)
Creatinine, Ser: 0.75 mg/dL (ref 0.57–1.00)
Globulin, Total: 2.9 g/dL (ref 1.5–4.5)
Glucose: 89 mg/dL (ref 70–99)
Potassium: 4.5 mmol/L (ref 3.5–5.2)
Sodium: 140 mmol/L (ref 134–144)
Total Protein: 6.6 g/dL (ref 6.0–8.5)
eGFR: 99 mL/min/{1.73_m2} (ref >59)

## 2021-06-01 LAB — CBC WITH DIFFERENTIAL/PLATELET
Basophils Absolute: 0.1 10*3/uL (ref 0.0–0.2)
Basos: 1 %
EOS (ABSOLUTE): 0.1 10*3/uL (ref 0.0–0.4)
Eos: 1 %
Hematocrit: 38.1 % (ref 34.0–46.6)
Hemoglobin: 12.4 g/dL (ref 11.1–15.9)
Immature Grans (Abs): 0 10*3/uL (ref 0.0–0.1)
Immature Granulocytes: 0 %
Lymphocytes Absolute: 1.9 10*3/uL (ref 0.7–3.1)
Lymphs: 29 %
MCH: 28 pg (ref 26.6–33.0)
MCHC: 32.5 g/dL (ref 31.5–35.7)
MCV: 86 fL (ref 79–97)
Monocytes Absolute: 0.3 10*3/uL (ref 0.1–0.9)
Monocytes: 5 %
Neutrophils Absolute: 4.3 10*3/uL (ref 1.4–7.0)
Neutrophils: 64 %
Platelets: 315 10*3/uL (ref 150–450)
RBC: 4.43 x10E6/uL (ref 3.77–5.28)
RDW: 12.9 % (ref 11.7–15.4)
WBC: 6.6 10*3/uL (ref 3.4–10.8)

## 2021-06-01 LAB — IRON,TIBC AND FERRITIN PANEL
Ferritin: 28 ng/mL (ref 15–150)
Iron Saturation: 21 % (ref 15–55)
Iron: 86 ug/dL (ref 27–159)
Total Iron Binding Capacity: 415 ug/dL (ref 250–450)
UIBC: 329 ug/dL (ref 131–425)

## 2021-06-01 LAB — LIPID PANEL
Chol/HDL Ratio: 3 ratio (ref 0.0–4.4)
Cholesterol, Total: 201 mg/dL — ABNORMAL HIGH (ref 100–199)
HDL: 68 mg/dL (ref >39)
LDL Chol Calc (NIH): 111 mg/dL — ABNORMAL HIGH (ref 0–99)
Triglycerides: 128 mg/dL (ref 0–149)
VLDL Cholesterol Cal: 22 mg/dL (ref 5–40)

## 2021-06-01 NOTE — Progress Notes (Signed)
No critical labs need to be addressed urgently. We will discuss labs in detail at upcoming office visit.   

## 2021-06-04 ENCOUNTER — Ambulatory Visit (INDEPENDENT_AMBULATORY_CARE_PROVIDER_SITE_OTHER): Payer: Managed Care, Other (non HMO) | Admitting: Family Medicine

## 2021-06-04 ENCOUNTER — Encounter: Payer: Self-pay | Admitting: Family Medicine

## 2021-06-04 ENCOUNTER — Other Ambulatory Visit: Payer: Self-pay

## 2021-06-04 VITALS — BP 138/90 | HR 76 | Ht 66.0 in | Wt 265.0 lb

## 2021-06-04 DIAGNOSIS — Z0000 Encounter for general adult medical examination without abnormal findings: Secondary | ICD-10-CM | POA: Diagnosis not present

## 2021-06-04 NOTE — Assessment & Plan Note (Signed)
Encouraged exercise, weight loss, healthy eating habits. ? ?

## 2021-06-04 NOTE — Progress Notes (Signed)
? ? Patient ID: Tammie Murphy, female    DOB: Apr 24, 1975, 46 y.o.   MRN: [id number] ? ?This visit was conducted in person. ? ?BP (!) 140/92   Pulse 76   Ht 5' 6"  (1.676 m)   Wt 265 lb (120.2 kg)   LMP 05/04/2021   SpO2 96%   BMI 42.77 kg/m?   ? ?CC:  ?Chief Complaint  ?Patient presents with  ? Annual Exam  ? ? ?Subjective:  ? ?HPI: ?Tammie Murphy is a 46 y.o. female presenting on 06/04/2021 for Annual Exam ? ? ?Patient is doing well overall.  She has no acute complaints. ? ?Morbid obesity:  Body mass index is 42.77 kg/m?. ? ?Wt Readings from Last 3 Encounters:  ?06/04/21 265 lb (120.2 kg)  ?11/05/20 264 lb (119.7 kg)  ?08/28/20 266 lb (120.7 kg)  ? ? ? Works from home. ? Diet: moderate ?Exercise: minimal ? ?Labs reviewed in detail with patient. ?   ?Lab Results  ?Component Value Date  ? CHOL 201 (H) 05/31/2021  ? HDL 68 05/31/2021  ? LDLCALC 111 (H) 05/31/2021  ? TRIG 128 05/31/2021  ? CHOLHDL 3.0 05/31/2021  ? ?The 10-year ASCVD risk score (Arnett DK, et al., 2019) is: 0.8% ?  Values used to calculate the score: ?    Age: 67 years ?    Sex: Female ?    Is Non-Hispanic African American: No ?    Diabetic: No ?    Tobacco smoker: No ?    Systolic Blood Pressure: 670 mmHg ?    Is BP treated: No ?    HDL Cholesterol: 68 mg/dL ?    Total Cholesterol: 201 mg/dL ? ?Menorrhagia improved with OCPs...  iron def anemia resolved. No longer on iron. ? ?Relevant past medical, surgical, family and social history reviewed and updated as indicated. Interim medical history since our last visit reviewed. ?Allergies and medications reviewed and updated. ?Outpatient Medications Prior to Visit  ?Medication Sig Dispense Refill  ? Ascorbic Acid (VITAMIN C GUMMIE PO) Take 2 each by mouth daily.    ? Bacillus Coagulans-Inulin (ALIGN PREBIOTIC-PROBIOTIC) 5-1.25 MG-GM CHEW     ? ESTARYLLA 0.25-35 MG-MCG tablet TAKE 1 TABLET BY MOUTH DAILY 84 tablet 4  ? ferrous sulfate 325 (65 FE) MG tablet Take 1 tablet (325 mg total) by mouth daily  with breakfast. 90 tablet 3  ? ?No facility-administered medications prior to visit.  ?  ? ?Per HPI unless specifically indicated in ROS section below ?Review of Systems  ?Constitutional:  Negative for fatigue and fever.  ?HENT:  Negative for congestion.   ?Eyes:  Negative for pain.  ?Respiratory:  Negative for cough and shortness of breath.   ?Cardiovascular:  Negative for chest pain, palpitations and leg swelling.  ?Gastrointestinal:  Negative for abdominal pain.  ?Genitourinary:  Negative for dysuria and vaginal bleeding.  ?Musculoskeletal:  Negative for back pain.  ?Neurological:  Negative for syncope, light-headedness and headaches.  ?Psychiatric/Behavioral:  Negative for dysphoric mood.   ?Objective:  ?BP (!) 140/92   Pulse 76   Ht 5' 6"  (1.676 m)   Wt 265 lb (120.2 kg)   LMP 05/04/2021   SpO2 96%   BMI 42.77 kg/m?   ?Wt Readings from Last 3 Encounters:  ?06/04/21 265 lb (120.2 kg)  ?11/05/20 264 lb (119.7 kg)  ?08/28/20 266 lb (120.7 kg)  ?  ?  ?Physical Exam ?   ?Results for orders placed or performed in visit on 05/31/21  ?  Iron, TIBC and Ferritin Panel  ?Result Value Ref Range  ? Total Iron Binding Capacity 415 250 - 450 ug/dL  ? UIBC 329 131 - 425 ug/dL  ? Iron 86 27 - 159 ug/dL  ? Iron Saturation 21 15 - 55 %  ? Ferritin 28 15 - 150 ng/mL  ?CBC with Differential/Platelet  ?Result Value Ref Range  ? WBC 6.6 3.4 - 10.8 x10E3/uL  ? RBC 4.43 3.77 - 5.28 x10E6/uL  ? Hemoglobin 12.4 11.1 - 15.9 g/dL  ? Hematocrit 38.1 34.0 - 46.6 %  ? MCV 86 79 - 97 fL  ? MCH 28.0 26.6 - 33.0 pg  ? MCHC 32.5 31.5 - 35.7 g/dL  ? RDW 12.9 11.7 - 15.4 %  ? Platelets 315 150 - 450 x10E3/uL  ? Neutrophils 64 Not Estab. %  ? Lymphs 29 Not Estab. %  ? Monocytes 5 Not Estab. %  ? Eos 1 Not Estab. %  ? Basos 1 Not Estab. %  ? Neutrophils Absolute 4.3 1.4 - 7.0 x10E3/uL  ? Lymphocytes Absolute 1.9 0.7 - 3.1 x10E3/uL  ? Monocytes Absolute 0.3 0.1 - 0.9 x10E3/uL  ? EOS (ABSOLUTE) 0.1 0.0 - 0.4 x10E3/uL  ? Basophils Absolute 0.1 0.0 -  0.2 x10E3/uL  ? Immature Granulocytes 0 Not Estab. %  ? Immature Grans (Abs) 0.0 0.0 - 0.1 x10E3/uL  ?Lipid panel  ?Result Value Ref Range  ? Cholesterol, Total 201 (H) 100 - 199 mg/dL  ? Triglycerides 128 0 - 149 mg/dL  ? HDL 68 >39 mg/dL  ? VLDL Cholesterol Cal 22 5 - 40 mg/dL  ? LDL Chol Calc (NIH) 111 (H) 0 - 99 mg/dL  ? Chol/HDL Ratio 3.0 0.0 - 4.4 ratio  ?Comprehensive metabolic panel  ?Result Value Ref Range  ? Glucose 89 70 - 99 mg/dL  ? BUN 10 6 - 24 mg/dL  ? Creatinine, Ser 0.75 0.57 - 1.00 mg/dL  ? eGFR 99 >59 mL/min/1.73  ? BUN/Creatinine Ratio 13 9 - 23  ? Sodium 140 134 - 144 mmol/L  ? Potassium 4.5 3.5 - 5.2 mmol/L  ? Chloride 101 96 - 106 mmol/L  ? CO2 25 20 - 29 mmol/L  ? Calcium 9.1 8.7 - 10.2 mg/dL  ? Total Protein 6.6 6.0 - 8.5 g/dL  ? Albumin 3.7 (L) 3.8 - 4.8 g/dL  ? Globulin, Total 2.9 1.5 - 4.5 g/dL  ? Albumin/Globulin Ratio 1.3 1.2 - 2.2  ? Bilirubin Total 0.2 0.0 - 1.2 mg/dL  ? Alkaline Phosphatase 79 44 - 121 IU/L  ? AST 15 0 - 40 IU/L  ? ALT 15 0 - 32 IU/L  ? ? ?This visit occurred during the SARS-CoV-2 public health emergency.  Safety protocols were in place, including screening questions prior to the visit, additional usage of staff PPE, and extensive cleaning of exam room while observing appropriate contact time as indicated for disinfecting solutions.  ? ?COVID 19 screen:  No recent travel or known exposure to [place] ?The patient denies respiratory symptoms of COVID 19 at this time. ?The importance of social distancing was discussed today.  ? ?Assessment and Plan ?The patient's preventative maintenance and recommended screening tests for an annual wellness exam were reviewed in full today. ?Brought up to date unless services declined. ? ?Counselled on the importance of diet, exercise, and its role in overall health and mortality. ?The patient's FH and SH was reviewed, including their home life, tobacco status, and drug and alcohol status.  ? ?Vaccines:  Uptodate with Td. Has gotten   COVID19 vaccine x 3 ?Pap/DVE:  08/2020 nml pap/HPV.. Dr. Glennon Mac repeat in 5 years. ?Mammo:  07/2020 low risk.. plan q 2 years. ?Colon: no early family history of colon cancer..  colonoscopy nml Dr. Allen Norris.. about 5 years ago. Due  ? Smoking Status: former smoker < 10 years, quit 18 years ago ?ETOH/ drug use: social ETOH ( 2 glasses of wine once a week, No drug use. ? HIV screen:   done in past, no concern now ?Hep C done ?  ?  ?Problem List Items Addressed This Visit   ? ? Morbid obesity ([place])  ?  Encouraged exercise, weight loss, healthy eating habits. ? ?  ?  ? ?Other Visit Diagnoses   ? ? Routine general medical examination at a health care facility    -  Primary  ? ?  ? ? ?Eliezer Lofts, MD  ? ?

## 2022-05-16 ENCOUNTER — Telehealth: Payer: Self-pay | Admitting: *Deleted

## 2022-05-16 DIAGNOSIS — Z13220 Encounter for screening for lipoid disorders: Secondary | ICD-10-CM

## 2022-05-16 NOTE — Telephone Encounter (Signed)
-----   Message from Velna Hatchet, RT sent at 05/16/2022  3:16 PM EST ----- Regarding: Thu 3/21 lab Patient is scheduled for cpx, please order future labs.  Thanks, Anda Kraft

## 2022-05-31 ENCOUNTER — Other Ambulatory Visit: Payer: Self-pay | Admitting: Obstetrics and Gynecology

## 2022-05-31 DIAGNOSIS — Z1231 Encounter for screening mammogram for malignant neoplasm of breast: Secondary | ICD-10-CM

## 2022-06-02 ENCOUNTER — Other Ambulatory Visit (INDEPENDENT_AMBULATORY_CARE_PROVIDER_SITE_OTHER): Payer: Managed Care, Other (non HMO)

## 2022-06-02 DIAGNOSIS — Z13220 Encounter for screening for lipoid disorders: Secondary | ICD-10-CM | POA: Diagnosis not present

## 2022-06-02 NOTE — Addendum Note (Signed)
Addended by: Susy Manor on: [id number] 08:32 AM   Modules accepted: Orders

## 2022-06-03 LAB — LIPID PANEL
Chol/HDL Ratio: 3 ratio (ref 0.0–4.4)
Cholesterol, Total: 200 mg/dL — ABNORMAL HIGH (ref 100–199)
HDL: 66 mg/dL (ref >39)
LDL Chol Calc (NIH): 118 mg/dL — ABNORMAL HIGH (ref 0–99)
Triglycerides: 87 mg/dL (ref 0–149)
VLDL Cholesterol Cal: 16 mg/dL (ref 5–40)

## 2022-06-03 LAB — COMPREHENSIVE METABOLIC PANEL
ALT: 23 IU/L (ref 0–32)
AST: 15 IU/L (ref 0–40)
Albumin/Globulin Ratio: 1.3 (ref 1.2–2.2)
Albumin: 3.7 g/dL — ABNORMAL LOW (ref 3.9–4.9)
Alkaline Phosphatase: 83 IU/L (ref 44–121)
BUN/Creatinine Ratio: 14 (ref 9–23)
BUN: 11 mg/dL (ref 6–24)
Bilirubin Total: <0.2 mg/dL (ref 0.0–1.2)
CO2: 24 mmol/L (ref 20–29)
Calcium: 8.7 mg/dL (ref 8.7–10.2)
Chloride: 105 mmol/L (ref 96–106)
Creatinine, Ser: 0.81 mg/dL (ref 0.57–1.00)
Globulin, Total: 2.9 g/dL (ref 1.5–4.5)
Glucose: 88 mg/dL (ref 70–99)
Potassium: 4.6 mmol/L (ref 3.5–5.2)
Sodium: 140 mmol/L (ref 134–144)
Total Protein: 6.6 g/dL (ref 6.0–8.5)
eGFR: 89 mL/min/{1.73_m2} (ref >59)

## 2022-06-03 LAB — LAB REPORT - SCANNED: EGFR: 89

## 2022-06-03 NOTE — Progress Notes (Signed)
No critical labs need to be addressed urgently. We will discuss labs in detail at upcoming office visit.   

## 2022-06-07 ENCOUNTER — Encounter: Payer: Self-pay | Admitting: Family Medicine

## 2022-06-07 ENCOUNTER — Ambulatory Visit (INDEPENDENT_AMBULATORY_CARE_PROVIDER_SITE_OTHER): Payer: Managed Care, Other (non HMO) | Admitting: Family Medicine

## 2022-06-07 VITALS — BP 120/80 | HR 90 | Temp 97.5°F | Ht 66.0 in | Wt 267.1 lb

## 2022-06-07 DIAGNOSIS — I10 Essential (primary) hypertension: Secondary | ICD-10-CM | POA: Insufficient documentation

## 2022-06-07 DIAGNOSIS — R030 Elevated blood-pressure reading, without diagnosis of hypertension: Secondary | ICD-10-CM | POA: Diagnosis not present

## 2022-06-07 DIAGNOSIS — Z0000 Encounter for general adult medical examination without abnormal findings: Secondary | ICD-10-CM | POA: Diagnosis not present

## 2022-06-07 NOTE — Assessment & Plan Note (Signed)
Acute, Borderline.  Noted elevated at dentist and gynecologist.  Here today improved on recheck. Encouraged low-salt diet and regular exercise and weight management.  She will follow blood pressure at home and call if her blood pressures remaining consistently above 140/90 for consideration of further workup including thyroid and possible initiation of medication.

## 2022-06-07 NOTE — Patient Instructions (Addendum)
Continue working on daily exercise.  Work on meal prep and healthy snacking.   Check blood pressure at home... arm cuff.. goal < 140/90.Marland Kitchen call if it is persistently elevated.

## 2022-06-07 NOTE — Progress Notes (Signed)
Patient ID: Tammie Murphy, female    DOB: [birth year], 47 y.o.   MRN: [id number]  This visit was conducted in person.  BP (!) 140/80   Pulse 90   Temp (!) 97.5 F (36.4 C) (Temporal)   Ht 5\' 6"  (1.676 m)   Wt 267 lb 2 oz (121.2 kg)   LMP 06/01/2022   SpO2 97%   BMI 43.12 kg/m    CC:  Chief Complaint  Patient presents with   Annual Exam    Subjective:   HPI: Tammie Murphy is a 47 y.o. female presenting on 06/07/2022 for Annual Exam   Patient is doing well overall.  She has no acute complaints.    Blood pressure slightly high at OV today. Was slightly high at GYN. BP Readings from Last 3 Encounters:  06/07/22 (!) 140/80  06/04/21 138/90  11/05/20 (!) 144/83    Morbid obesity:  Body mass index is 43.12 kg/m.  Wt Readings from Last 3 Encounters:  06/07/22 267 lb 2 oz (121.2 kg)  06/04/21 265 lb (120.2 kg)  11/05/20 264 lb (119.7 kg)  Body mass index is 43.12 kg/m.    Works from home.  Diet: moderate Exercise: trying to use treadmill daily  Labs reviewed in detail with patient.    Lab Results  Component Value Date   CHOL 200 (H) 06/02/2022   HDL 66 06/02/2022   LDLCALC 118 (H) 06/02/2022   TRIG 87 06/02/2022   CHOLHDL 3.0 06/02/2022   The 10-year ASCVD risk score (Arnett DK, et al., 2019) is: 1%   Values used to calculate the score:     Age: 8 years     Sex: Female     Is Non-Hispanic African American: No     Diabetic: No     Tobacco smoker: No     Systolic Blood Pressure: [id number] mmHg     Is BP treated: No     HDL Cholesterol: 66 mg/dL     Total Cholesterol: 200 mg/dL  Menorrhagia improved with OCPs...  iron def anemia resolved. No longer on iron.  Relevant past medical, surgical, family and social history reviewed and updated as indicated. Interim medical history since our last visit reviewed. Allergies and medications reviewed and updated. Outpatient Medications Prior to Visit  Medication Sig Dispense Refill   Bacillus Coagulans-Inulin  (ALIGN PREBIOTIC-PROBIOTIC) 5-1.25 MG-GM CHEW      cetirizine (ZYRTEC) 10 MG tablet Take 10 mg by mouth daily as needed for allergies.     ESTARYLLA 0.25-35 MG-MCG tablet TAKE 1 TABLET BY MOUTH DAILY 84 tablet 4   Ascorbic Acid (VITAMIN C GUMMIE PO) Take 2 each by mouth daily.     No facility-administered medications prior to visit.     Per HPI unless specifically indicated in ROS section below Review of Systems  Constitutional:  Negative for fatigue and fever.  HENT:  Negative for congestion.   Eyes:  Negative for pain.  Respiratory:  Negative for cough and shortness of breath.   Cardiovascular:  Negative for chest pain, palpitations and leg swelling.  Gastrointestinal:  Negative for abdominal pain.  Genitourinary:  Negative for dysuria and vaginal bleeding.  Musculoskeletal:  Negative for back pain.  Neurological:  Negative for syncope, light-headedness and headaches.  Psychiatric/Behavioral:  Negative for dysphoric mood.    Objective:  BP (!) 140/80   Pulse 90   Temp (!) 97.5 F (36.4 C) (Temporal)   Ht 5\' 6"  (1.676 m)   Wt 267  lb 2 oz (121.2 kg)   LMP 06/01/2022   SpO2 97%   BMI 43.12 kg/m   Wt Readings from Last 3 Encounters:  06/07/22 267 lb 2 oz (121.2 kg)  06/04/21 265 lb (120.2 kg)  11/05/20 264 lb (119.7 kg)      Physical Exam Constitutional:      General: She is not in acute distress.    Appearance: Normal appearance. She is well-developed. She is obese. She is not ill-appearing or toxic-appearing.  HENT:     Head: Normocephalic.     Right Ear: Hearing, tympanic membrane, ear canal and external ear normal.     Left Ear: Hearing, tympanic membrane, ear canal and external ear normal.     Nose: Nose normal.  Eyes:     General: Lids are normal. Lids are everted, no foreign bodies appreciated.     Conjunctiva/sclera: Conjunctivae normal.     Pupils: Pupils are equal, round, and reactive to light.  Neck:     Thyroid: No thyroid mass or thyromegaly.      Vascular: No carotid bruit.     Trachea: Trachea normal.  Cardiovascular:     Rate and Rhythm: Normal rate and regular rhythm.     Heart sounds: Normal heart sounds, S1 normal and S2 normal. No murmur heard.    No gallop.  Pulmonary:     Effort: Pulmonary effort is normal. No respiratory distress.     Breath sounds: Normal breath sounds. No wheezing, rhonchi or rales.  Abdominal:     General: Bowel sounds are normal. There is no distension or abdominal bruit.     Palpations: Abdomen is soft. There is no fluid wave or mass.     Tenderness: There is no abdominal tenderness. There is no guarding or rebound.     Hernia: No hernia is present.  Musculoskeletal:     Cervical back: Normal range of motion and neck supple.  Lymphadenopathy:     Cervical: No cervical adenopathy.  Skin:    General: Skin is warm and dry.     Findings: No rash.  Neurological:     Mental Status: She is alert.     Cranial Nerves: No cranial nerve deficit.     Sensory: No sensory deficit.  Psychiatric:        Mood and Affect: Mood is not anxious or depressed.        Speech: Speech normal.        Behavior: Behavior normal. Behavior is cooperative.        Judgment: Judgment normal.       Results for orders placed or performed in visit on 06/03/22  Lab report - scanned  Result Value Ref Range   EGFR 89.0     This visit occurred during the SARS-CoV-2 public health emergency.  Safety protocols were in place, including screening questions prior to the visit, additional usage of staff PPE, and extensive cleaning of exam room while observing appropriate contact time as indicated for disinfecting solutions.   COVID 19 screen:  No recent travel or known exposure to COVID19 The patient denies respiratory symptoms of COVID 19 at this time. The importance of social distancing was discussed today.   Assessment and Plan The patient's preventative maintenance and recommended screening tests for an annual wellness exam  were reviewed in full today. Brought up to date unless services declined.  Counselled on the importance of diet, exercise, and its role in overall health and mortality. The patient's FH and  SH was reviewed, including their home life, tobacco status, and drug and alcohol status.   Vaccines: Uptodate with Td. Has gotten  COVID19 vaccine x 3 Pap/DVE:  08/2020 nml pap/HPV.. Dr. Glennon Mac repeat in 5 years. Mammo:  07/2020 low risk.. plan q 2 years... scheduled 06/21/2022 Colon: no early family history of colon cancer..  colonoscopy nml Dr. Allen Norris.. about 5 years ago.  Likely due 2025.  Smoking Status: former smoker < 10 years, quit 18 years ago ETOH/ drug use: social ETOH ( 1 glasses of wine once a week, No drug use.  HIV screen:   done in past, no concern now Hep C done     Problem List Items Addressed This Visit     Elevated blood pressure reading in office without diagnosis of hypertension    Acute, Borderline.  Noted elevated at dentist and gynecologist.  Here today improved on recheck. Encouraged low-salt diet and regular exercise and weight management.  She will follow blood pressure at home and call if her blood pressures remaining consistently above 140/90 for consideration of further workup including thyroid and possible initiation of medication.      Morbid obesity ([place])    Encouraged exercise, weight loss, healthy eating habits.       Other Visit Diagnoses     Routine general medical examination at a health care facility    -  Primary      Eliezer Lofts, MD

## 2022-06-07 NOTE — Assessment & Plan Note (Signed)
Encouraged exercise, weight loss, healthy eating habits. ? ?

## 2022-06-21 ENCOUNTER — Ambulatory Visit
Admission: RE | Admit: 2022-06-21 | Discharge: 2022-06-21 | Disposition: A | Discharge status: Home / Self Care | Payer: Managed Care, Other (non HMO) | Source: Ambulatory Visit | Attending: Obstetrics and Gynecology | Admitting: Obstetrics and Gynecology

## 2022-06-21 DIAGNOSIS — Z1231 Encounter for screening mammogram for malignant neoplasm of breast: Secondary | ICD-10-CM

## 2022-06-24 ENCOUNTER — Other Ambulatory Visit: Payer: Self-pay | Admitting: Obstetrics and Gynecology

## 2022-06-24 DIAGNOSIS — R928 Other abnormal and inconclusive findings on diagnostic imaging of breast: Secondary | ICD-10-CM

## 2022-06-24 DIAGNOSIS — N6489 Other specified disorders of breast: Secondary | ICD-10-CM

## 2022-07-04 ENCOUNTER — Ambulatory Visit
Admission: RE | Admit: 2022-07-04 | Discharge: 2022-07-04 | Disposition: A | Discharge status: Home / Self Care | Payer: Managed Care, Other (non HMO) | Source: Ambulatory Visit | Attending: Obstetrics and Gynecology | Admitting: Obstetrics and Gynecology

## 2022-07-04 DIAGNOSIS — R928 Other abnormal and inconclusive findings on diagnostic imaging of breast: Secondary | ICD-10-CM | POA: Insufficient documentation

## 2022-07-04 DIAGNOSIS — N6489 Other specified disorders of breast: Secondary | ICD-10-CM | POA: Diagnosis present

## 2023-05-08 ENCOUNTER — Telehealth: Payer: Self-pay | Admitting: *Deleted

## 2023-05-08 DIAGNOSIS — Z13220 Encounter for screening for lipoid disorders: Secondary | ICD-10-CM

## 2023-05-08 DIAGNOSIS — D509 Iron deficiency anemia, unspecified: Secondary | ICD-10-CM

## 2023-05-08 NOTE — Telephone Encounter (Signed)
-----   Message from Lovena Neighbours sent at 05/08/2023  3:03 PM EST ----- Regarding: Labs for Friday 3.21.25 Please put physical lab orders in future. Thank you, Denny Peon

## 2023-06-02 ENCOUNTER — Other Ambulatory Visit (INDEPENDENT_AMBULATORY_CARE_PROVIDER_SITE_OTHER): Payer: Managed Care, Other (non HMO)

## 2023-06-02 DIAGNOSIS — Z13220 Encounter for screening for lipoid disorders: Secondary | ICD-10-CM

## 2023-06-02 DIAGNOSIS — D509 Iron deficiency anemia, unspecified: Secondary | ICD-10-CM

## 2023-06-02 NOTE — Addendum Note (Signed)
 Addended by: Alvina Chou on: 06/02/2023 07:49 AM   Modules accepted: Orders

## 2023-06-02 NOTE — Addendum Note (Signed)
 Addended by: Alvina Chou on: 06/02/2023 07:48 AM   Modules accepted: Orders

## 2023-06-03 LAB — COMPREHENSIVE METABOLIC PANEL
ALT: 20 IU/L (ref 0–32)
AST: 15 IU/L (ref 0–40)
Albumin: 3.8 g/dL — ABNORMAL LOW (ref 3.9–4.9)
Alkaline Phosphatase: 86 IU/L (ref 44–121)
BUN/Creatinine Ratio: 16 (ref 9–23)
BUN: 12 mg/dL (ref 6–24)
Bilirubin Total: 0.3 mg/dL (ref 0.0–1.2)
CO2: 25 mmol/L (ref 20–29)
Calcium: 8.9 mg/dL (ref 8.7–10.2)
Chloride: 103 mmol/L (ref 96–106)
Creatinine, Ser: 0.77 mg/dL (ref 0.57–1.00)
Globulin, Total: 2.5 g/dL (ref 1.5–4.5)
Glucose: 88 mg/dL (ref 70–99)
Potassium: 4.7 mmol/L (ref 3.5–5.2)
Sodium: 141 mmol/L (ref 134–144)
Total Protein: 6.3 g/dL (ref 6.0–8.5)
eGFR: 95 mL/min/{1.73_m2} (ref >59)

## 2023-06-03 LAB — IRON,TIBC AND FERRITIN PANEL
Ferritin: 72 ng/mL (ref 15–150)
Iron Saturation: 23 % (ref 15–55)
Iron: 73 ug/dL (ref 27–159)
Total Iron Binding Capacity: 317 ug/dL (ref 250–450)
UIBC: 244 ug/dL (ref 131–425)

## 2023-06-03 LAB — LIPID PANEL
Chol/HDL Ratio: 3.5 ratio (ref 0.0–4.4)
Cholesterol, Total: 218 mg/dL — ABNORMAL HIGH (ref 100–199)
HDL: 62 mg/dL (ref >39)
LDL Chol Calc (NIH): 142 mg/dL — ABNORMAL HIGH (ref 0–99)
Triglycerides: 78 mg/dL (ref 0–149)
VLDL Cholesterol Cal: 14 mg/dL (ref 5–40)

## 2023-06-03 LAB — CBC WITH DIFFERENTIAL/PLATELET
Basophils Absolute: 0.1 10*3/uL (ref 0.0–0.2)
Basos: 1 %
EOS (ABSOLUTE): 0.1 10*3/uL (ref 0.0–0.4)
Eos: 1 %
Hematocrit: 38.9 % (ref 34.0–46.6)
Hemoglobin: 12.8 g/dL (ref 11.1–15.9)
Immature Grans (Abs): 0 10*3/uL (ref 0.0–0.1)
Immature Granulocytes: 0 %
Lymphocytes Absolute: 2 10*3/uL (ref 0.7–3.1)
Lymphs: 33 %
MCH: 28.6 pg (ref 26.6–33.0)
MCHC: 32.9 g/dL (ref 31.5–35.7)
MCV: 87 fL (ref 79–97)
Monocytes Absolute: 0.4 10*3/uL (ref 0.1–0.9)
Monocytes: 7 %
Neutrophils Absolute: 3.4 10*3/uL (ref 1.4–7.0)
Neutrophils: 58 %
Platelets: 330 10*3/uL (ref 150–450)
RBC: 4.47 x10E6/uL (ref 3.77–5.28)
RDW: 12.9 % (ref 11.7–15.4)
WBC: 5.9 10*3/uL (ref 3.4–10.8)

## 2023-06-06 ENCOUNTER — Encounter: Payer: Self-pay | Admitting: Family Medicine

## 2023-06-06 NOTE — Progress Notes (Signed)
 No critical labs need to be addressed urgently. We will discuss labs in detail at upcoming office visit.

## 2023-06-09 ENCOUNTER — Ambulatory Visit (INDEPENDENT_AMBULATORY_CARE_PROVIDER_SITE_OTHER): Payer: Managed Care, Other (non HMO) | Admitting: Family Medicine

## 2023-06-09 ENCOUNTER — Encounter: Payer: Self-pay | Admitting: Family Medicine

## 2023-06-09 VITALS — BP 138/86 | HR 83 | Temp 98.0°F | Ht 66.0 in | Wt 269.4 lb

## 2023-06-09 DIAGNOSIS — R030 Elevated blood-pressure reading, without diagnosis of hypertension: Secondary | ICD-10-CM | POA: Diagnosis not present

## 2023-06-09 DIAGNOSIS — Z8249 Family history of ischemic heart disease and other diseases of the circulatory system: Secondary | ICD-10-CM | POA: Insufficient documentation

## 2023-06-09 DIAGNOSIS — Z0000 Encounter for general adult medical examination without abnormal findings: Secondary | ICD-10-CM | POA: Diagnosis not present

## 2023-06-09 MED ORDER — SEMAGLUTIDE-WEIGHT MANAGEMENT 0.25 MG/0.5ML ~~LOC~~ SOAJ: 0.2500 mg | SUBCUTANEOUS | 1 refills | Status: DC

## 2023-06-09 NOTE — Assessment & Plan Note (Signed)
 Screening is recommended for abdominal aortic aneurysm (AAA) in first-degree relatives of patients who have experienced an AAA rupture.  Screening should be performed using ultrasonography.  Recommended age for screening is between 2 and 75 years, or older than 75 years if in good health.

## 2023-06-09 NOTE — Progress Notes (Addendum)
 Patient ID: KEVEN SOUCY, female    DOB: February 07, 1976, 48 y.o.   MRN: [id number]  This visit was conducted in person.  BP 138/86 (BP Location: Right Arm, Patient Position: Sitting, Cuff Size: Large)   Pulse 83   Temp 98 F (36.7 C) (Temporal)   Ht 5\' 6"  (1.676 m)   Wt 269 lb 6 oz (122.2 kg)   SpO2 97%   BMI 43.48 kg/m    CC:  Chief Complaint  Patient presents with   Annual Exam    Subjective:   HPI: CARLEENA MIRES is a 48 y.o. female presenting on 06/09/2023 for Annual Exam  The patient presents for  complete physical and review of chronic health problems. He/She also has the following acute concerns today:  Patient is doing well overall.  She has no acute complaints.   Blood pressure slightly high at OV today.  BP Readings from Last 3 Encounters:  06/09/23 138/86  06/07/22 120/80  06/04/21 138/90   Not checking at home.  Morbid obesity:  Body mass index is 43.48 kg/m.  Wt Readings from Last 3 Encounters:  06/09/23 269 lb 6 oz (122.2 kg)  06/07/22 267 lb 2 oz (121.2 kg)  06/04/21 265 lb (120.2 kg)  Body mass index is 43.48 kg/m.    Works from home.  Diet: moderate, trying to use app and count calories Exercise: trying to use treadmill daily  Labs reviewed in detail with patient.    Lab Results  Component Value Date   CHOL 218 (H) 06/02/2023   HDL 62 06/02/2023   LDLCALC 142 (H) 06/02/2023   TRIG 78 06/02/2023   CHOLHDL 3.5 06/02/2023   The 10-year ASCVD risk score (Arnett DK, et al., 2019) is: 1.3%   Values used to calculate the score:     Age: 16 years     Sex: Female     Is Non-Hispanic African American: No     Diabetic: No     Tobacco smoker: No     Systolic Blood Pressure: 138 mmHg     Is BP treated: No     HDL Cholesterol: 62 mg/dL     Total Cholesterol: 218 mg/dL  Menorrhagia improved with OCPs...  iron def anemia resolved. No longer on iron.  Relevant past medical, surgical, family and social history reviewed and updated as  indicated. Interim medical history since our last visit reviewed. Allergies and medications reviewed and updated. Outpatient Medications Prior to Visit  Medication Sig Dispense Refill   ALPRAZolam (XANAX) 0.25 MG tablet Take 0.25 mg by mouth daily as needed for anxiety.     Bacillus Coagulans-Inulin (ALIGN PREBIOTIC-PROBIOTIC) 5-1.25 MG-GM CHEW      cetirizine (ZYRTEC) 10 MG tablet Take 10 mg by mouth daily as needed for allergies.     ESTARYLLA 0.25-35 MG-MCG tablet TAKE 1 TABLET BY MOUTH DAILY 84 tablet 4   No facility-administered medications prior to visit.     Per HPI unless specifically indicated in ROS section below Review of Systems  Constitutional:  Negative for fatigue and fever.  HENT:  Negative for congestion.   Eyes:  Negative for pain.  Respiratory:  Negative for cough and shortness of breath.   Cardiovascular:  Negative for chest pain, palpitations and leg swelling.  Gastrointestinal:  Negative for abdominal pain.  Genitourinary:  Negative for dysuria and vaginal bleeding.  Musculoskeletal:  Negative for back pain.  Neurological:  Negative for syncope, light-headedness and headaches.  Psychiatric/Behavioral:  Negative for  dysphoric mood.    Objective:  BP 138/86 (BP Location: Right Arm, Patient Position: Sitting, Cuff Size: Large)   Pulse 83   Temp 98 F (36.7 C) (Temporal)   Ht 5\' 6"  (1.676 m)   Wt 269 lb 6 oz (122.2 kg)   SpO2 97%   BMI 43.48 kg/m   Wt Readings from Last 3 Encounters:  06/09/23 269 lb 6 oz (122.2 kg)  06/07/22 267 lb 2 oz (121.2 kg)  06/04/21 265 lb (120.2 kg)      Physical Exam Constitutional:      General: She is not in acute distress.    Appearance: Normal appearance. She is well-developed. She is obese. She is not ill-appearing or toxic-appearing.  HENT:     Head: Normocephalic.     Right Ear: Hearing, tympanic membrane, ear canal and external ear normal.     Left Ear: Hearing, tympanic membrane, ear canal and external ear normal.      Nose: Nose normal.  Eyes:     General: Lids are normal. Lids are everted, no foreign bodies appreciated.     Conjunctiva/sclera: Conjunctivae normal.     Pupils: Pupils are equal, round, and reactive to light.  Neck:     Thyroid: No thyroid mass or thyromegaly.     Vascular: No carotid bruit.     Trachea: Trachea normal.  Cardiovascular:     Rate and Rhythm: Normal rate and regular rhythm.     Heart sounds: Normal heart sounds, S1 normal and S2 normal. No murmur heard.    No gallop.  Pulmonary:     Effort: Pulmonary effort is normal. No respiratory distress.     Breath sounds: Normal breath sounds. No wheezing, rhonchi or rales.  Abdominal:     General: Bowel sounds are normal. There is no distension or abdominal bruit.     Palpations: Abdomen is soft. There is no fluid wave or mass.     Tenderness: There is no abdominal tenderness. There is no guarding or rebound.     Hernia: No hernia is present.  Musculoskeletal:     Cervical back: Normal range of motion and neck supple.  Lymphadenopathy:     Cervical: No cervical adenopathy.  Skin:    General: Skin is warm and dry.     Findings: No rash.  Neurological:     Mental Status: She is alert.     Cranial Nerves: No cranial nerve deficit.     Sensory: No sensory deficit.  Psychiatric:        Mood and Affect: Mood is not anxious or depressed.        Speech: Speech normal.        Behavior: Behavior normal. Behavior is cooperative.        Judgment: Judgment normal.       Results for orders placed or performed in visit on 06/02/23  Comprehensive metabolic panel   Collection Time: 06/02/23  7:54 AM  Result Value Ref Range   Glucose 88 70 - 99 mg/dL   BUN 12 6 - 24 mg/dL   Creatinine, Ser 1.61 0.57 - 1.00 mg/dL   eGFR 95 >09 UE/AVW/0.98   BUN/Creatinine Ratio 16 9 - 23   Sodium 141 134 - 144 mmol/L   Potassium 4.7 3.5 - 5.2 mmol/L   Chloride 103 96 - 106 mmol/L   CO2 25 20 - 29 mmol/L   Calcium 8.9 8.7 - 10.2 mg/dL    Total Protein 6.3 6.0 - 8.5  g/dL   Albumin 3.8 (L) 3.9 - 4.9 g/dL   Globulin, Total 2.5 1.5 - 4.5 g/dL   Bilirubin Total 0.3 0.0 - 1.2 mg/dL   Alkaline Phosphatase 86 44 - 121 IU/L   AST 15 0 - 40 IU/L   ALT 20 0 - 32 IU/L  Lipid panel   Collection Time: 06/02/23  7:54 AM  Result Value Ref Range   Cholesterol, Total 218 (H) 100 - 199 mg/dL   Triglycerides 78 0 - 149 mg/dL   HDL 62 >84 mg/dL   VLDL Cholesterol Cal 14 5 - 40 mg/dL   LDL Chol Calc (NIH) 696 (H) 0 - 99 mg/dL   Chol/HDL Ratio 3.5 0.0 - 4.4 ratio  CBC with Differential/Platelet   Collection Time: 06/02/23  7:54 AM  Result Value Ref Range   WBC 5.9 3.4 - 10.8 x10E3/uL   RBC 4.47 3.77 - 5.28 x10E6/uL   Hemoglobin 12.8 11.1 - 15.9 g/dL   Hematocrit 29.5 28.4 - 46.6 %   MCV 87 79 - 97 fL   MCH 28.6 26.6 - 33.0 pg   MCHC 32.9 31.5 - 35.7 g/dL   RDW 13.2 44.0 - 10.2 %   Platelets 330 150 - 450 x10E3/uL   Neutrophils 58 Not Estab. %   Lymphs 33 Not Estab. %   Monocytes 7 Not Estab. %   Eos 1 Not Estab. %   Basos 1 Not Estab. %   Neutrophils Absolute 3.4 1.4 - 7.0 x10E3/uL   Lymphocytes Absolute 2.0 0.7 - 3.1 x10E3/uL   Monocytes Absolute 0.4 0.1 - 0.9 x10E3/uL   EOS (ABSOLUTE) 0.1 0.0 - 0.4 x10E3/uL   Basophils Absolute 0.1 0.0 - 0.2 x10E3/uL   Immature Granulocytes 0 Not Estab. %   Immature Grans (Abs) 0.0 0.0 - 0.1 x10E3/uL  Iron, TIBC and Ferritin Panel   Collection Time: 06/02/23  7:54 AM  Result Value Ref Range   Total Iron Binding Capacity 317 250 - 450 ug/dL   UIBC 725 366 - 440 ug/dL   Iron 73 27 - 347 ug/dL   Iron Saturation 23 15 - 55 %   Ferritin 72 15 - 150 ng/mL    This visit occurred during the SARS-CoV-2 public health emergency.  Safety protocols were in place, including screening questions prior to the visit, additional usage of staff PPE, and extensive cleaning of exam room while observing appropriate contact time as indicated for disinfecting solutions.   COVID 19 screen:  No recent travel or  known exposure to COVID19 The patient denies respiratory symptoms of COVID 19 at this time. The importance of social distancing was discussed today.   Assessment and Plan The patient's preventative maintenance and recommended screening tests for an annual wellness exam were reviewed in full today. Brought up to date unless services declined.  Counselled on the importance of diet, exercise, and its role in overall health and mortality. The patient's FH and SH was reviewed, including their home life, tobacco status, and drug and alcohol status.   Vaccines: Uptodate with Td. Has gotten  COVID19 vaccine x 3 Pap/DVE:  08/2020 nml pap/HPV.. Dr. Jean Rosenthal repeat in 5 years. Mammo:  06/2022 Colon: no early family history of colon cancer..  colonoscopy nml Dr. Servando Snare...  Likely due 2025.  Smoking Status: former smoker < 10 years, quit 18 years ago ETOH/ drug use: social ETOH ( 1 glasses of wine once a week, No drug use.  HIV screen:   done in past, no concern  now Hep C done     Problem List Items Addressed This Visit     Elevated blood pressure reading in office without diagnosis of hypertension   Acute, Borderline.  Noted elevated at dentist and gynecologist.  Here today improved on recheck. Encouraged low-salt diet and regular exercise and weight management.  She will follow blood pressure at home and call if her blood pressures remaining consistently above 140/90 for consideration of further workup including thyroid and possible initiation of medication.      Family history of abdominal aortic aneurysm (AAA)   Screening is recommended for abdominal aortic aneurysm (AAA) in first-degree relatives of patients who have experienced an AAA rupture.  Screening should be performed using ultrasonography.  Recommended age for screening is between 66 and 75 years, or older than 75 years if in good health.      Morbid obesity ([facility])   Encouraged exercise, weight loss, healthy eating habits. She is able  to lose weight but always gains it back.  Stimulants contraindicated. Comorbidity: borderline elevated    Will start semaglutide 0.25 mg weekly.Marland Kitchen discussed med and possible SE in detail.  Continue working on lifestyle changes.  Return for re-eval in 1 month       Relevant Medications   Semaglutide-Weight Management 0.25 MG/0.5ML SOAJ   Other Visit Diagnoses       Routine general medical examination at a health care facility    -  Primary       Kerby Nora, MD

## 2023-06-09 NOTE — Assessment & Plan Note (Signed)
Acute, Borderline.  Noted elevated at dentist and gynecologist.  Here today improved on recheck. Encouraged low-salt diet and regular exercise and weight management.  She will follow blood pressure at home and call if her blood pressures remaining consistently above 140/90 for consideration of further workup including thyroid and possible initiation of medication.

## 2023-06-09 NOTE — Patient Instructions (Addendum)
 Check BP at home.. record measurements.  Start semaglutide for weight management.  Work on healthy lifestyle changes.  Call Dr. Servando Snare to set up colonoscopy.  Set u mammogram with GYN.

## 2023-06-09 NOTE — Assessment & Plan Note (Addendum)
 Encouraged exercise, weight loss, healthy eating habits. She is able to lose weight but always gains it back.  Stimulants contraindicated. Comorbidity: borderline elevated    Will start semaglutide 0.25 mg weekly.Marland Kitchen discussed med and possible SE in detail.  Continue working on lifestyle changes.  Return for re-eval in 1 month

## 2023-06-13 ENCOUNTER — Telehealth: Payer: Self-pay | Admitting: Pharmacy Technician

## 2023-06-13 ENCOUNTER — Other Ambulatory Visit (HOSPITAL_COMMUNITY): Payer: Self-pay

## 2023-06-13 NOTE — Telephone Encounter (Signed)
 Pharmacy Patient Advocate Encounter   Received notification from CoverMyMeds that prior authorization for [employer]govy 0.25MG /0.5ML auto-injectors is required/requested.   Insurance verification completed.   The patient is insured through [facility] .   Per test claim: PA required; PA submitted to above mentioned insurance via CoverMyMeds Key/confirmation #/EOC [id number] Status is pending

## 2023-06-13 NOTE — Telephone Encounter (Signed)
 Pharmacy Patient Advocate Encounter  Received notification from [facility] that Prior Authorization for [facility] 0.25MG /0.5ML auto-injectors has been APPROVED from 06/13/23 to 12/13/23   PA #/Case ID/Reference #: [id number]

## 2023-07-07 ENCOUNTER — Ambulatory Visit: Admitting: Family Medicine

## 2023-07-07 ENCOUNTER — Encounter: Payer: Self-pay | Admitting: Family Medicine

## 2023-07-07 VITALS — BP 120/76 | HR 76 | Temp 99.2°F | Ht 66.0 in | Wt 262.5 lb

## 2023-07-07 DIAGNOSIS — I10 Essential (primary) hypertension: Secondary | ICD-10-CM

## 2023-07-07 MED ORDER — HYDROCHLOROTHIAZIDE 25 MG PO TABS: 25.0000 mg | ORAL_TABLET | Freq: Every day | ORAL | 3 refills | Status: AC

## 2023-07-07 MED ORDER — SEMAGLUTIDE-WEIGHT MANAGEMENT 0.5 MG/0.5ML ~~LOC~~ SOAJ: 0.5000 mg | SUBCUTANEOUS | 0 refills | Status: DC

## 2023-07-07 NOTE — Progress Notes (Signed)
 Patient ID: Tammie Murphy, female    DOB: 01/08/1976, 48 y.o.   MRN: [id number]  This visit was conducted in person.  BP 120/76 (BP Location: Left Arm, Patient Position: Sitting, Cuff Size: Large)   Pulse 76   Temp 99.2 F (37.3 C) (Temporal)   Ht 5\' 6"  (1.676 m)   Wt 262 lb 8 oz (119.1 kg)   SpO2 96%   BMI 42.37 kg/m    CC:  Chief Complaint  Patient presents with   Blood Pressure Check   Obesity    Weight management    Subjective:   HPI: Tammie Murphy is a 48 y.o. female presenting on 07/07/2023 for Blood Pressure Check and Obesity (Weight management)  1 month follow-up after semaglutide  start..  On 0.25 mg weekly for 4 weeks.  She has been counting calories, she has decrease cravings, eating less. Gets full faster.  No N/V, abdominal pain. No constipation. She has had 7 pound weight loss.  Works from home.  Diet: moderate, trying to use app and count calories Exercise: trying to use treadmill daily   Blood pressure improved at OV today but at home running 137-161/93-101 No associated symptoms. BP Readings from Last 3 Encounters:  07/07/23 120/76  06/09/23 138/86  06/07/22 120/80   Not checking at home.  Morbid obesity:  Body mass index is 42.37 kg/m.  Wt Readings from Last 3 Encounters:  07/07/23 262 lb 8 oz (119.1 kg)  06/09/23 269 lb 6 oz (122.2 kg)  06/07/22 267 lb 2 oz (121.2 kg)  Body mass index is 42.37 kg/m.    Relevant past medical, surgical, family and social history reviewed and updated as indicated. Interim medical history since our last visit reviewed. Allergies and medications reviewed and updated. Outpatient Medications Prior to Visit  Medication Sig Dispense Refill   ALPRAZolam  (XANAX ) 0.25 MG tablet Take 0.25 mg by mouth daily as needed for anxiety.     Bacillus Coagulans-Inulin (ALIGN PREBIOTIC-PROBIOTIC) 5-1.25 MG-GM CHEW      cetirizine (ZYRTEC) 10 MG tablet Take 10 mg by mouth daily as needed for allergies.     ESTARYLLA  0.25-35 MG-MCG tablet TAKE 1 TABLET BY MOUTH DAILY 84 tablet 4   Semaglutide -Weight Management 0.25 MG/0.5ML SOAJ Inject 0.25 mg into the skin once a week. 2 mL 1   No facility-administered medications prior to visit.     Per HPI unless specifically indicated in ROS section below Review of Systems  Constitutional:  Negative for fatigue and fever.  HENT:  Negative for congestion.   Eyes:  Negative for pain.  Respiratory:  Negative for cough and shortness of breath.   Cardiovascular:  Negative for chest pain, palpitations and leg swelling.  Gastrointestinal:  Negative for abdominal pain.  Genitourinary:  Negative for dysuria and vaginal bleeding.  Musculoskeletal:  Negative for back pain.  Neurological:  Negative for syncope, light-headedness and headaches.  Psychiatric/Behavioral:  Negative for dysphoric mood.    Objective:  BP 120/76 (BP Location: Left Arm, Patient Position: Sitting, Cuff Size: Large)   Pulse 76   Temp 99.2 F (37.3 C) (Temporal)   Ht 5\' 6"  (1.676 m)   Wt 262 lb 8 oz (119.1 kg)   SpO2 96%   BMI 42.37 kg/m   Wt Readings from Last 3 Encounters:  07/07/23 262 lb 8 oz (119.1 kg)  06/09/23 269 lb 6 oz (122.2 kg)  06/07/22 267 lb 2 oz (121.2 kg)      Physical Exam Constitutional:  General: She is not in acute distress.    Appearance: Normal appearance. She is well-developed. She is obese. She is not ill-appearing or toxic-appearing.  HENT:     Head: Normocephalic.     Right Ear: Hearing, tympanic membrane, ear canal and external ear normal. Tympanic membrane is not erythematous, retracted or bulging.     Left Ear: Hearing, tympanic membrane, ear canal and external ear normal. Tympanic membrane is not erythematous, retracted or bulging.     Nose: Nose normal. No mucosal edema or rhinorrhea.     Right Sinus: No maxillary sinus tenderness or frontal sinus tenderness.     Left Sinus: No maxillary sinus tenderness or frontal sinus tenderness.     Mouth/Throat:      Pharynx: Uvula midline.  Eyes:     General: Lids are normal. Lids are everted, no foreign bodies appreciated.     Conjunctiva/sclera: Conjunctivae normal.     Pupils: Pupils are equal, round, and reactive to light.  Neck:     Thyroid: No thyroid mass or thyromegaly.     Vascular: No carotid bruit.     Trachea: Trachea normal.  Cardiovascular:     Rate and Rhythm: Normal rate and regular rhythm.     Pulses: Normal pulses.     Heart sounds: Normal heart sounds, S1 normal and S2 normal. No murmur heard.    No friction rub. No gallop.  Pulmonary:     Effort: Pulmonary effort is normal. No tachypnea or respiratory distress.     Breath sounds: Normal breath sounds. No decreased breath sounds, wheezing, rhonchi or rales.  Abdominal:     General: Bowel sounds are normal. There is no distension or abdominal bruit.     Palpations: Abdomen is soft. There is no fluid wave or mass.     Tenderness: There is no abdominal tenderness. There is no guarding or rebound.     Hernia: No hernia is present.  Musculoskeletal:     Cervical back: Normal range of motion and neck supple.  Lymphadenopathy:     Cervical: No cervical adenopathy.  Skin:    General: Skin is warm and dry.     Findings: No rash.  Neurological:     Mental Status: She is alert.     Cranial Nerves: No cranial nerve deficit.     Sensory: No sensory deficit.  Psychiatric:        Mood and Affect: Mood is not anxious or depressed.        Speech: Speech normal.        Behavior: Behavior normal. Behavior is cooperative.        Thought Content: Thought content normal.        Judgment: Judgment normal.       Results for orders placed or performed in visit on 06/02/23  Comprehensive metabolic panel   Collection Time: 06/02/23  7:54 AM  Result Value Ref Range   Glucose 88 70 - 99 mg/dL   BUN 12 6 - 24 mg/dL   Creatinine, Ser 7.82 0.57 - 1.00 mg/dL   eGFR 95 >95 AO/ZHY/8.65   BUN/Creatinine Ratio 16 9 - 23   Sodium 141 134 -  144 mmol/L   Potassium 4.7 3.5 - 5.2 mmol/L   Chloride 103 96 - 106 mmol/L   CO2 25 20 - 29 mmol/L   Calcium 8.9 8.7 - 10.2 mg/dL   Total Protein 6.3 6.0 - 8.5 g/dL   Albumin 3.8 (L) 3.9 - 4.9 g/dL  Globulin, Total 2.5 1.5 - 4.5 g/dL   Bilirubin Total 0.3 0.0 - 1.2 mg/dL   Alkaline Phosphatase 86 44 - 121 IU/L   AST 15 0 - 40 IU/L   ALT 20 0 - 32 IU/L  Lipid panel   Collection Time: 06/02/23  7:54 AM  Result Value Ref Range   Cholesterol, Total 218 (H) 100 - 199 mg/dL   Triglycerides 78 0 - 149 mg/dL   HDL 62 >16 mg/dL   VLDL Cholesterol Cal 14 5 - 40 mg/dL   LDL Chol Calc (NIH) 109 (H) 0 - 99 mg/dL   Chol/HDL Ratio 3.5 0.0 - 4.4 ratio  CBC with Differential/Platelet   Collection Time: 06/02/23  7:54 AM  Result Value Ref Range   WBC 5.9 3.4 - 10.8 x10E3/uL   RBC 4.47 3.77 - 5.28 x10E6/uL   Hemoglobin 12.8 11.1 - 15.9 g/dL   Hematocrit 60.4 54.0 - 46.6 %   MCV 87 79 - 97 fL   MCH 28.6 26.6 - 33.0 pg   MCHC 32.9 31.5 - 35.7 g/dL   RDW 98.1 19.1 - 47.8 %   Platelets 330 150 - 450 x10E3/uL   Neutrophils 58 Not Estab. %   Lymphs 33 Not Estab. %   Monocytes 7 Not Estab. %   Eos 1 Not Estab. %   Basos 1 Not Estab. %   Neutrophils Absolute 3.4 1.4 - 7.0 x10E3/uL   Lymphocytes Absolute 2.0 0.7 - 3.1 x10E3/uL   Monocytes Absolute 0.4 0.1 - 0.9 x10E3/uL   EOS (ABSOLUTE) 0.1 0.0 - 0.4 x10E3/uL   Basophils Absolute 0.1 0.0 - 0.2 x10E3/uL   Immature Granulocytes 0 Not Estab. %   Immature Grans (Abs) 0.0 0.0 - 0.1 x10E3/uL  Iron, TIBC and Ferritin Panel   Collection Time: 06/02/23  7:54 AM  Result Value Ref Range   Total Iron Binding Capacity 317 250 - 450 ug/dL   UIBC 295 621 - 308 ug/dL   Iron 73 27 - 657 ug/dL   Iron Saturation 23 15 - 55 %   Ferritin 72 15 - 150 ng/mL    This visit occurred during the SARS-CoV-2 public health emergency.  Safety protocols were in place, including screening questions prior to the visit, additional usage of staff PPE, and extensive cleaning  of exam room while observing appropriate contact time as indicated for disinfecting solutions.   COVID 19 screen:  No recent travel or known exposure to COVID19 The patient denies respiratory symptoms of COVID 19 at this time. The importance of social distancing was discussed today.   Assessment and Plan  Problem List Items Addressed This Visit     Hypertension - Primary   New diagnosis, likely over time weight management and weight loss will improve blood pressure but given elevations at home and borderline measurements in office we will start hydrochlorothiazide  25 mg daily. Unremarkable labs at physical 1 month ago. Encouraged heart healthy diet and continued weight loss. She will also make sure she is using a large blood pressure cuff at home.    Reevaluate in 3 months.      Relevant Medications   hydrochlorothiazide  (HYDRODIURIL ) 25 MG tablet   Morbid obesity (HCC)   Chronic, significant improvement with appetite and snacking on semaglutide  0.25 mg weekly.  Decreased weight of 7 pounds from baseline.  Encouraged to continue to exercise and healthy eating habits.  Make sure to get adequate protein and water intake.  No side effects. Will increase semaglutide  to  0.5 mg weekly. Follow-up in 3 months for reevaluation of weight.      Relevant Medications   Semaglutide -Weight Management 0.5 MG/0.5ML SOAJ     Herby Lolling, MD

## 2023-07-07 NOTE — Assessment & Plan Note (Signed)
 New diagnosis, likely over time weight management and weight loss will improve blood pressure but given elevations at home and borderline measurements in office we will start hydrochlorothiazide 25 mg daily. Unremarkable labs at physical 1 month ago. Encouraged heart healthy diet and continued weight loss. She will also make sure she is using a large blood pressure cuff at home.    Reevaluate in 3 months.

## 2023-07-07 NOTE — Patient Instructions (Signed)
 Start hydrochlorothiazide 25 mg daily. Follow BP at home... send measurement in 1- 2 weeks... goal < 140/90.  Increase semaglutide  to 0.5 mg.

## 2023-07-07 NOTE — Assessment & Plan Note (Signed)
 Chronic, significant improvement with appetite and snacking on semaglutide  0.25 mg weekly.  Decreased weight of 7 pounds from baseline.  Encouraged to continue to exercise and healthy eating habits.  Make sure to get adequate protein and water intake.  No side effects. Will increase semaglutide  to 0.5 mg weekly. Follow-up in 3 months for reevaluation of weight.

## 2023-08-14 ENCOUNTER — Encounter: Payer: Self-pay | Admitting: Family Medicine

## 2023-08-16 ENCOUNTER — Telehealth: Payer: Self-pay

## 2023-08-16 ENCOUNTER — Other Ambulatory Visit: Payer: Self-pay | Admitting: Obstetrics and Gynecology

## 2023-08-16 DIAGNOSIS — Z1211 Encounter for screening for malignant neoplasm of colon: Secondary | ICD-10-CM

## 2023-08-16 DIAGNOSIS — Z1231 Encounter for screening mammogram for malignant neoplasm of breast: Secondary | ICD-10-CM

## 2023-08-16 NOTE — Telephone Encounter (Signed)
 Patient contacted office to schedule her colonoscopy with Dr. Ole Berkeley.  Informed her that we did not have a referral in Epic for her.  Offered to message her PCP to request referral for screening colonoscopy.  Informed patient I would call her back once we received referral to schedule.  Message routed to PCP to request colonoscopy referral to Dr. Ole Berkeley.  Thanks,  [place], CMA

## 2023-08-17 NOTE — Addendum Note (Signed)
 Addended by: Herby Lolling E on: 08/17/2023 01:45 PM   Modules accepted: Orders

## 2023-08-22 ENCOUNTER — Telehealth: Payer: Self-pay

## 2023-08-22 ENCOUNTER — Other Ambulatory Visit: Payer: Self-pay

## 2023-08-22 DIAGNOSIS — Z1211 Encounter for screening for malignant neoplasm of colon: Secondary | ICD-10-CM

## 2023-08-22 MED ORDER — NA SULFATE-K SULFATE-MG SULF 17.5-3.13-1.6 GM/177ML PO SOLN: 1.0000 | Freq: Once | ORAL | 0 refills | Status: AC

## 2023-08-22 NOTE — Telephone Encounter (Signed)
 Gastroenterology Pre-Procedure Review  Request Date: 10/10/23 Requesting Physician: Dr. Ole Berkeley  PATIENT REVIEW QUESTIONS: The patient responded to the following health history questions as indicated:    1. Are you having any GI issues? no 2. Do you have a personal history of Polyps? no 3. Do you have a family history of Colon Cancer or Polyps? no 4. Diabetes Mellitus? no 5. Joint replacements in the past 12 months?no 6. Major health problems in the past 3 months?no 7. Any artificial heart valves, MVP, or defibrillator?no    MEDICATIONS & ALLERGIES:    Patient reports the following regarding taking any anticoagulation/antiplatelet therapy:   Plavix, Coumadin, Eliquis, Xarelto, Lovenox, Pradaxa, Brilinta, or Effient? no Aspirin? no  Patient confirms/reports the following medications:  Current Outpatient Medications  Medication Sig Dispense Refill   Na Sulfate-K Sulfate-Mg Sulfate concentrate (SUPREP) 17.5-3.13-1.6 GM/177ML SOLN Take 1 kit (354 mLs total) by mouth once for 1 dose. 354 mL 0   ALPRAZolam  (XANAX ) 0.25 MG tablet Take 0.25 mg by mouth daily as needed for anxiety.     Bacillus Coagulans-Inulin (ALIGN PREBIOTIC-PROBIOTIC) 5-1.25 MG-GM CHEW      cetirizine (ZYRTEC) 10 MG tablet Take 10 mg by mouth daily as needed for allergies.     ESTARYLLA 0.25-35 MG-MCG tablet TAKE 1 TABLET BY MOUTH DAILY 84 tablet 4   hydrochlorothiazide  (HYDRODIURIL ) 25 MG tablet Take 1 tablet (25 mg total) by mouth daily. 90 tablet 3   Semaglutide -Weight Management 0.5 MG/0.5ML SOAJ Inject 0.5 mg into the skin once a week. 6 mL 0   No current facility-administered medications for this visit.    Patient confirms/reports the following allergies:  Allergies  Allergen Reactions   Flonase [Fluticasone Propionate] Anaphylaxis    No orders of the defined types were placed in this encounter.   AUTHORIZATION INFORMATION Primary Insurance: 1D#: Group #:  Secondary Insurance: 1D#: Group #:  SCHEDULE  INFORMATION: Date: 10/10/23 Time: Location: [facility]

## 2023-09-01 ENCOUNTER — Ambulatory Visit
Admission: RE | Admit: 2023-09-01 | Discharge: 2023-09-01 | Disposition: A | Discharge status: Home / Self Care | Source: Ambulatory Visit | Attending: Obstetrics and Gynecology | Admitting: Obstetrics and Gynecology

## 2023-09-01 DIAGNOSIS — Z1231 Encounter for screening mammogram for malignant neoplasm of breast: Secondary | ICD-10-CM | POA: Insufficient documentation

## 2023-09-12 ENCOUNTER — Other Ambulatory Visit: Payer: Self-pay | Admitting: Family Medicine

## 2023-09-12 MED ORDER — SEMAGLUTIDE-WEIGHT MANAGEMENT 1 MG/0.5ML ~~LOC~~ SOAJ: 1.0000 mg | SUBCUTANEOUS | 3 refills | Status: DC

## 2023-10-02 ENCOUNTER — Telehealth: Payer: Self-pay

## 2023-10-02 NOTE — Telephone Encounter (Signed)
 Spoke with pt she state the letter has that screening colonoscopy isn't covered if it done before the age of 75. Per pt she was told it was the age of 37 so she will be getting procedure.

## 2023-10-06 ENCOUNTER — Ambulatory Visit: Admitting: Family Medicine

## 2023-10-10 ENCOUNTER — Ambulatory Visit
Admission: RE | Admit: 2023-10-10 | Discharge: 2023-10-10 | Disposition: A | Discharge status: Home / Self Care | Source: Ambulatory Visit | Attending: Gastroenterology | Admitting: Gastroenterology

## 2023-10-10 ENCOUNTER — Ambulatory Visit: Admitting: Anesthesiology

## 2023-10-10 ENCOUNTER — Encounter: Payer: Self-pay | Admitting: Gastroenterology

## 2023-10-10 ENCOUNTER — Encounter
Admission: RE | Disposition: A | Discharge status: Home / Self Care | Payer: Self-pay | Source: Ambulatory Visit | Attending: Gastroenterology

## 2023-10-10 ENCOUNTER — Other Ambulatory Visit: Payer: Self-pay

## 2023-10-10 DIAGNOSIS — Z87891 Personal history of nicotine dependence: Secondary | ICD-10-CM | POA: Insufficient documentation

## 2023-10-10 DIAGNOSIS — Z79899 Other long term (current) drug therapy: Secondary | ICD-10-CM | POA: Insufficient documentation

## 2023-10-10 DIAGNOSIS — Z6836 Body mass index (BMI) 36.0-36.9, adult: Secondary | ICD-10-CM | POA: Insufficient documentation

## 2023-10-10 DIAGNOSIS — E66813 Obesity, class 3: Secondary | ICD-10-CM | POA: Diagnosis not present

## 2023-10-10 DIAGNOSIS — Z1211 Encounter for screening for malignant neoplasm of colon: Secondary | ICD-10-CM | POA: Diagnosis not present

## 2023-10-10 HISTORY — PX: COLONOSCOPY: SHX5424

## 2023-10-10 LAB — POCT PREGNANCY, URINE: Preg Test, Ur: NEGATIVE

## 2023-10-10 SURGERY — COLONOSCOPY
Anesthesia: General

## 2023-10-10 MED ORDER — PROPOFOL 10 MG/ML IV BOLUS
INTRAVENOUS | Status: AC
  Filled 2023-10-10: qty 20

## 2023-10-10 MED ORDER — PROPOFOL 10 MG/ML IV BOLUS
INTRAVENOUS | Status: DC | PRN
  Administered 2023-10-10: 30 mg via INTRAVENOUS
  Administered 2023-10-10: 50 mg via INTRAVENOUS
  Administered 2023-10-10: 20 mg via INTRAVENOUS
  Administered 2023-10-10: 50 mg via INTRAVENOUS
  Administered 2023-10-10: 30 mg via INTRAVENOUS
  Administered 2023-10-10: 20 mg via INTRAVENOUS

## 2023-10-10 MED ORDER — SODIUM CHLORIDE 0.9 % IV SOLN: INTRAVENOUS | Status: DC

## 2023-10-10 MED ORDER — LIDOCAINE HCL (PF) 2 % IJ SOLN
INTRAMUSCULAR | Status: DC | PRN
  Administered 2023-10-10: 40 mg via INTRADERMAL

## 2023-10-10 NOTE — Op Note (Signed)
 [facility] Gastroenterology Patient Name: Tammie Murphy Procedure Date: 10/10/2023 10:00 AM MRN: [id number] Account #: [id number] Date of Birth: March 25, 1975 Admit Type: Outpatient Age: 48 Room: [facility] ENDO ROOM 4 Gender: Female Note Status: Finalized Instrument Name: [id number] Procedure:             Colonoscopy Indications:           Screening for colorectal malignant neoplasm Providers:             Rogelia Copping MD, MD Referring MD:          Greig CHARLENA Ring MD, MD (Referring MD) Medicines:             Propofol  per Anesthesia Complications:         No immediate complications. Procedure:             Pre-Anesthesia Assessment:                        - Prior to the procedure, a History and Physical was                         performed, and patient medications and allergies were                         reviewed. The patient's tolerance of previous                         anesthesia was also reviewed. The risks and benefits                         of the procedure and the sedation options and risks                         were discussed with the patient. All questions were                         answered, and informed consent was obtained. Prior                         Anticoagulants: The patient has taken no anticoagulant                         or antiplatelet agents. ASA Grade Assessment: II - A                         patient with mild systemic disease. After reviewing                         the risks and benefits, the patient was deemed in                         satisfactory condition to undergo the procedure.                        After obtaining informed consent, the colonoscope was                         passed under direct vision. Throughout the procedure,  the patient's blood pressure, pulse, and oxygen                         saturations were monitored continuously. The                         Colonoscope was introduced  through the anus and                         advanced to the the cecum, identified by appendiceal                         orifice and ileocecal valve. The colonoscopy was                         performed without difficulty. The patient tolerated                         the procedure well. The quality of the bowel                         preparation was excellent. Findings:      The perianal and digital rectal examinations were normal.      The colon (entire examined portion) appeared normal. Impression:            - The entire examined colon is normal.                        - No specimens collected. Recommendation:        - Discharge patient to home.                        - Resume previous diet.                        - Continue present medications.                        - Repeat colonoscopy in 10 years for screening                         purposes. Procedure Code(s):     --- Professional ---                        [phone], Colonoscopy, flexible; diagnostic, including                         collection of specimen(s) by brushing or washing, when                         performed (separate procedure) Diagnosis Code(s):     --- Professional ---                        Z12.11, Encounter for screening for malignant neoplasm                         of colon CPT copyright 2022 American Medical Association. All rights reserved. The codes documented in this report are preliminary and upon coder review may  be  revised to meet current compliance requirements. Rogelia Copping MD, MD 10/10/2023 10:21:13 AM This report has been signed electronically. Number of Addenda: 0 Note Initiated On: 10/10/2023 10:00 AM Scope Withdrawal Time: 0 hours 6 minutes 52 seconds  Total Procedure Duration: 0 hours 11 minutes 7 seconds  Estimated Blood Loss:  Estimated blood loss: none.      [facility]

## 2023-10-10 NOTE — H&P (Signed)
 Rogelia Copping, MD [facility] [address] [place], [place] [address] Phone: [phone] Fax : [phone]  Primary Care Physician:  Avelina Greig BRAVO, MD Primary Gastroenterologist:  Dr. Copping  Pre-Procedure History & Physical: HPI:  Tammie Murphy is a 48 y.o. female is here for a screening colonoscopy.   Past Medical History:  Diagnosis Date   GERD (gastroesophageal reflux disease)     Past Surgical History:  Procedure Laterality Date   DILATION AND CURETTAGE OF UTERUS N/A 2000   after miscarriage   TUBAL LIGATION     WISDOM TOOTH EXTRACTION      Prior to Admission medications   Medication Sig Start Date End Date Taking? Authorizing Provider  hydrochlorothiazide  (HYDRODIURIL ) 25 MG tablet Take 1 tablet (25 mg total) by mouth daily. 07/07/23  Yes Bedsole, Amy E, MD  ALPRAZolam  (XANAX ) 0.25 MG tablet Take 0.25 mg by mouth daily as needed for anxiety.    [provider]  Bacillus Coagulans-Inulin (ALIGN PREBIOTIC-PROBIOTIC) 5-1.25 MG-GM CHEW     [provider]  cetirizine (ZYRTEC) 10 MG tablet Take 10 mg by mouth daily as needed for allergies.    [provider]  ESTARYLLA 0.25-35 MG-MCG tablet TAKE 1 TABLET BY MOUTH DAILY 02/13/21   Carlin Rollene HERO, CNM  Semaglutide -Weight Management 1 MG/0.5ML SOAJ Inject 1 mg into the skin once a week. 09/12/23   Avelina Greig BRAVO, MD    Allergies as of 08/22/2023 - Review Complete 08/22/2023  Allergen Reaction Noted   Flonase [fluticasone propionate] Anaphylaxis 12/18/2014    Family History  Problem Relation Age of Onset   Hypertension Mother    Alcohol abuse Father    AAA (abdominal aortic aneurysm) Father    Hypertension Father    AAA (abdominal aortic aneurysm) Maternal Grandmother    Hypertension Maternal Grandfather    Heart attack Maternal Grandfather    Heart disease Maternal Grandfather    AAA (abdominal aortic aneurysm) Paternal Grandmother    Alzheimer's disease Paternal Grandmother     Prostate cancer Paternal Grandfather    Heart disease Paternal Grandfather    Breast cancer Neg Hx     Social History   Socioeconomic History   Marital status: Married    Spouse name: Alm   Number of children: 1   Years of education: Not on file   Highest education level: 12th grade  Occupational History   Not on file  Tobacco Use   Smoking status: Former   Smokeless tobacco: Never  Vaping Use   Vaping status: Never Used  Substance and Sexual Activity   Alcohol use: Yes    Comment: social   Drug use: No   Sexual activity: Yes    Birth control/protection: Surgical  Other Topics Concern   Not on file  Social History Narrative   Not on file   Social Drivers of Health   Financial Resource Strain: Low Risk  (10/09/2023)   Overall Financial Resource Strain (CARDIA)    Difficulty of Paying Living Expenses: Not hard at all  Food Insecurity: No Food Insecurity (10/09/2023)   Hunger Vital Sign    Worried About Running Out of Food in the Last Year: Never true    Ran Out of Food in the Last Year: Never true  Transportation Needs: No Transportation Needs (10/09/2023)   PRAPARE - Administrator, Civil Service (Medical): No    Lack of Transportation (Non-Medical): No  Physical Activity: Inactive (10/09/2023)   Exercise Vital Sign  Days of Exercise per Week: 0 days    Minutes of Exercise per Session: Not on file  Stress: No Stress Concern Present (10/09/2023)   [employer] of Occupational Health - Occupational Stress Questionnaire    Feeling of Stress: Not at all  Social Connections: Moderately Integrated (10/09/2023)   Social Connection and Isolation Panel    Frequency of Communication with Friends and Family: More than three times a week    Frequency of Social Gatherings with Friends and Family: Once a week    Attends Religious Services: 1 to 4 times per year    Active Member of [employer] or Organizations: No    Attends Engineer, structural: Not on file     Marital Status: Married  Catering manager Violence: Not on file    Review of Systems: See HPI, otherwise negative ROS  Physical Exam: BP 124/73   Pulse 68   Temp (!) 96.3 F (35.7 C) (Tympanic)   Resp 18   Ht 5' 6 (1.676 m)   Wt 103.9 kg   LMP 09/19/2023 Comment: tubes tied 6yrs ago  SpO2 98%   BMI 36.96 kg/m  General:   Alert,  pleasant and cooperative in NAD Head:  Normocephalic and atraumatic. Neck:  Supple; no masses or thyromegaly. Lungs:  Clear throughout to auscultation.    Heart:  Regular rate and rhythm. Abdomen:  Soft, nontender and nondistended. Normal bowel sounds, without guarding, and without rebound.   Neurologic:  Alert and  oriented x4;  grossly normal neurologically.  Impression/Plan: Tammie Murphy is now here to undergo a screening colonoscopy.  Risks, benefits, and alternatives regarding colonoscopy have been reviewed with the patient.  Questions have been answered.  All parties agreeable.

## 2023-10-10 NOTE — Transfer of Care (Signed)
 Immediate Anesthesia Transfer of Care Note  Patient: Tammie Murphy  Procedure(s) Performed: COLONOSCOPY  Patient Location: PACU  Anesthesia Type:MAC  Level of Consciousness: drowsy  Airway & Oxygen Therapy: Patient Spontanous Breathing and Patient connected to nasal cannula oxygen  Post-op Assessment: Report given to RN and Post -op Vital signs reviewed and stable  Post vital signs: Reviewed and stable  Last Vitals:  Vitals Value Taken Time  BP 99/59 10/10/23 10:23  Temp 35.8 C 10/10/23 10:20  Pulse 74 10/10/23 10:23  Resp 21 10/10/23 10:23  SpO2 99 % 10/10/23 10:23  Vitals shown include unfiled device data.  Last Pain:  Vitals:   10/10/23 1020  TempSrc: Temporal         Complications: No notable events documented.

## 2023-10-10 NOTE — Anesthesia Postprocedure Evaluation (Signed)
 Anesthesia Post Note  Patient: Tammie Murphy  Procedure(s) Performed: COLONOSCOPY  Patient location during evaluation: PACU Anesthesia Type: General Level of consciousness: awake and alert, oriented and patient cooperative Pain management: pain level controlled Vital Signs Assessment: post-procedure vital signs reviewed and stable Respiratory status: spontaneous breathing, nonlabored ventilation and respiratory function stable Cardiovascular status: blood pressure returned to baseline and stable Postop Assessment: adequate PO intake Anesthetic complications: no   No notable events documented.   Last Vitals:  Vitals:   10/10/23 1040 10/10/23 1046  BP:  106/67  Pulse: 66   Resp: 14 15  Temp:    SpO2: 100%     Last Pain:  Vitals:   10/10/23 1020  TempSrc: Temporal                 Sharicka Pogorzelski

## 2023-10-10 NOTE — Anesthesia Preprocedure Evaluation (Addendum)
 Anesthesia Evaluation  Patient identified by MRN, date of birth, ID band Patient awake    Reviewed: Allergy & Precautions, NPO status , Patient's Chart, lab work & pertinent test results  History of Anesthesia Complications Negative for: history of anesthetic complications  Airway Mallampati: IV   Neck ROM: Full    Dental no notable dental hx.    Pulmonary former smoker (quit 20 years ago)   Pulmonary exam normal breath sounds clear to auscultation       Cardiovascular Exercise Tolerance: Good Normal cardiovascular exam Rhythm:Regular Rate:Normal     Neuro/Psych negative neurological ROS     GI/Hepatic ,GERD  ,,  Endo/Other    Class 3 obesity  Renal/GU negative Renal ROS     Musculoskeletal   Abdominal   Peds  Hematology   Anesthesia Other Findings Last dose of Semaglutide  10/01/23.  Reproductive/Obstetrics                              Anesthesia Physical Anesthesia Plan  ASA: 3  Anesthesia Plan: General   Post-op Pain Management:    Induction: Intravenous  PONV Risk Score and Plan: 3 and Propofol  infusion, TIVA and Treatment may vary due to age or medical condition  Airway Management Planned: Natural Airway  Additional Equipment:   Intra-op Plan:   Post-operative Plan:   Informed Consent: I have reviewed the patients History and Physical, chart, labs and discussed the procedure including the risks, benefits and alternatives for the proposed anesthesia with the patient or authorized representative who has indicated his/her understanding and acceptance.       Plan Discussed with: CRNA  Anesthesia Plan Comments: (LMA/GETA backup discussed.  Patient consented for risks of anesthesia including but not limited to:  - adverse reactions to medications - damage to eyes, teeth, lips or other oral mucosa - nerve damage due to positioning  - sore throat or hoarseness - damage  to heart, brain, nerves, lungs, other parts of body or loss of life  Informed patient about role of CRNA in peri- and intra-operative care.  Patient voiced understanding.)         Anesthesia Quick Evaluation

## 2023-10-11 ENCOUNTER — Encounter: Payer: Self-pay | Admitting: Gastroenterology

## 2023-10-13 ENCOUNTER — Encounter: Payer: Self-pay | Admitting: Family Medicine

## 2023-10-13 ENCOUNTER — Ambulatory Visit: Admitting: Family Medicine

## 2023-10-13 VITALS — BP 90/70 | HR 83 | Temp 98.6°F | Ht 66.0 in | Wt 228.2 lb

## 2023-10-13 DIAGNOSIS — I10 Essential (primary) hypertension: Secondary | ICD-10-CM | POA: Diagnosis not present

## 2023-10-13 DIAGNOSIS — E66812 Obesity, class 2: Secondary | ICD-10-CM | POA: Diagnosis not present

## 2023-10-13 DIAGNOSIS — Z6836 Body mass index (BMI) 36.0-36.9, adult: Secondary | ICD-10-CM | POA: Diagnosis not present

## 2023-10-13 NOTE — Assessment & Plan Note (Signed)
 Chronic, improving.  Great work on weight loss and lifestyle change. Plans to continue diet app and regular exercise.   Semaglutide  1 mg weekly... mild constipation.. can use miralax prn.

## 2023-10-13 NOTE — Assessment & Plan Note (Signed)
 Stable, chronic.  Continue current medication.  Hydrochlorothiazide  25 mg daily... if lower BP and lightheadedness with continued weight loss change to  prn.

## 2023-10-13 NOTE — Progress Notes (Signed)
 Patient ID: Tammie Murphy, female    DOB: Mar 07, 1976, 48 y.o.   MRN: [id number]  This visit was conducted in person.  BP 90/70   Pulse 83   Temp 98.6 F (37 C) (Temporal)   Ht 5' 6 (1.676 m)   Wt 228 lb 4 oz (103.5 kg)   SpO2 98%   BMI 36.84 kg/m    CC:  Chief Complaint  Patient presents with   Hypertension    3 month follow up   Weight Management Screening    Subjective:   HPI: Tammie Murphy is a 48 y.o. female presenting on 10/13/2023 for Hypertension (3 month follow up) and Weight Management Screening  Weight management:   On 1 mg weekly for last  4 weeks.  She has been counting calories, she has decrease cravings, eating less. Gets full faster. Using lose it app  No N/V, abdominal pain. Mild constipation.  Works from home.  Diet: moderate, trying to use app and count calories Exercise: trying to use treadmill daily   Has lost almost 40 lbs in last 3-4 months Wt Readings from Last 3 Encounters:  10/13/23 228 lb 4 oz (103.5 kg)  10/10/23 229 lb (103.9 kg)  07/07/23 262 lb 8 oz (119.1 kg)   Body mass index is 36.84 kg/m.   Hypertension:   Now on hydrochlorothiazide  daily    She is feeling better overall. Using medication without problems or lightheadedness:  none Chest pain with exertion: none Edema:none Short of breath: none Average home BPs: 111-130/70s Other issues:  BP Readings from Last 3 Encounters:  10/13/23 90/70  10/10/23 106/67  07/07/23 120/76   Not checking at home.      Relevant past medical, surgical, family and social history reviewed and updated as indicated. Interim medical history since our last visit reviewed. Allergies and medications reviewed and updated. Outpatient Medications Prior to Visit  Medication Sig Dispense Refill   ALPRAZolam  (XANAX ) 0.25 MG tablet Take 0.25 mg by mouth daily as needed for anxiety.     Bacillus Coagulans-Inulin (ALIGN PREBIOTIC-PROBIOTIC) 5-1.25 MG-GM CHEW      cetirizine (ZYRTEC) 10 MG  tablet Take 10 mg by mouth daily as needed for allergies.     ESTARYLLA 0.25-35 MG-MCG tablet TAKE 1 TABLET BY MOUTH DAILY 84 tablet 4   hydrochlorothiazide  (HYDRODIURIL ) 25 MG tablet Take 1 tablet (25 mg total) by mouth daily. 90 tablet 3   Multiple Vitamins-Minerals (CENTRUM WOMEN MULTIGUMMIES) CHEW Chew 2 each by mouth daily.     Semaglutide -Weight Management 1 MG/0.5ML SOAJ Inject 1 mg into the skin once a week. 2 mL 3   No facility-administered medications prior to visit.     Per HPI unless specifically indicated in ROS section below Review of Systems  Constitutional:  Negative for fatigue and fever.  HENT:  Negative for congestion.   Eyes:  Negative for pain.  Respiratory:  Negative for cough and shortness of breath.   Cardiovascular:  Negative for chest pain, palpitations and leg swelling.  Gastrointestinal:  Negative for abdominal pain.  Genitourinary:  Negative for dysuria and vaginal bleeding.  Musculoskeletal:  Negative for back pain.  Neurological:  Negative for syncope, light-headedness and headaches.  Psychiatric/Behavioral:  Negative for dysphoric mood.    Objective:  BP 90/70   Pulse 83   Temp 98.6 F (37 C) (Temporal)   Ht 5' 6 (1.676 m)   Wt 228 lb 4 oz (103.5 kg)   SpO2 98%   BMI  36.84 kg/m   Wt Readings from Last 3 Encounters:  10/13/23 228 lb 4 oz (103.5 kg)  10/10/23 229 lb (103.9 kg)  07/07/23 262 lb 8 oz (119.1 kg)      Physical Exam Constitutional:      General: She is not in acute distress.    Appearance: Normal appearance. She is well-developed. She is obese. She is not ill-appearing or toxic-appearing.  HENT:     Head: Normocephalic.     Right Ear: Hearing, tympanic membrane, ear canal and external ear normal. Tympanic membrane is not erythematous, retracted or bulging.     Left Ear: Hearing, tympanic membrane, ear canal and external ear normal. Tympanic membrane is not erythematous, retracted or bulging.     Nose: Nose normal. No mucosal  edema or rhinorrhea.     Right Sinus: No maxillary sinus tenderness or frontal sinus tenderness.     Left Sinus: No maxillary sinus tenderness or frontal sinus tenderness.     Mouth/Throat:     Pharynx: Uvula midline.  Eyes:     General: Lids are normal. Lids are everted, no foreign bodies appreciated.     Conjunctiva/sclera: Conjunctivae normal.     Pupils: Pupils are equal, round, and reactive to light.  Neck:     Thyroid: No thyroid mass or thyromegaly.     Vascular: No carotid bruit.     Trachea: Trachea normal.  Cardiovascular:     Rate and Rhythm: Normal rate and regular rhythm.     Pulses: Normal pulses.     Heart sounds: Normal heart sounds, S1 normal and S2 normal. No murmur heard.    No friction rub. No gallop.  Pulmonary:     Effort: Pulmonary effort is normal. No tachypnea or respiratory distress.     Breath sounds: Normal breath sounds. No decreased breath sounds, wheezing, rhonchi or rales.  Abdominal:     General: Bowel sounds are normal. There is no distension or abdominal bruit.     Palpations: Abdomen is soft. There is no fluid wave or mass.     Tenderness: There is no abdominal tenderness. There is no guarding or rebound.     Hernia: No hernia is present.  Musculoskeletal:     Cervical back: Normal range of motion and neck supple.  Lymphadenopathy:     Cervical: No cervical adenopathy.  Skin:    General: Skin is warm and dry.     Findings: No rash.  Neurological:     Mental Status: She is alert.     Cranial Nerves: No cranial nerve deficit.     Sensory: No sensory deficit.  Psychiatric:        Mood and Affect: Mood is not anxious or depressed.        Speech: Speech normal.        Behavior: Behavior normal. Behavior is cooperative.        Thought Content: Thought content normal.        Judgment: Judgment normal.       Results for orders placed or performed during the hospital encounter of 10/10/23  Pregnancy, urine POC   Collection Time: 10/10/23   9:40 AM  Result Value Ref Range   Preg Test, Ur NEGATIVE NEGATIVE    This visit occurred during the SARS-CoV-2 public health emergency.  Safety protocols were in place, including screening questions prior to the visit, additional usage of staff PPE, and extensive cleaning of exam room while observing appropriate contact time as indicated for  disinfecting solutions.   COVID 19 screen:  No recent travel or known exposure to COVID19 The patient denies respiratory symptoms of COVID 19 at this time. The importance of social distancing was discussed today.   Assessment and Plan  Problem List Items Addressed This Visit     Class 2 severe obesity due to excess calories with serious comorbidity and body mass index (BMI) of 36.0 to 36.9 in adult [facility])    Chronic, improving.  Great work on weight loss and lifestyle change. Plans to continue diet app and regular exercise.   Semaglutide  1 mg weekly... mild constipation.. can use miralax prn.      Hypertension - Primary   Stable, chronic.  Continue current medication.  Hydrochlorothiazide  25 mg daily... if lower BP and lightheadedness with continued weight loss change to  prn.         Greig Ring, MD

## 2024-01-09 ENCOUNTER — Other Ambulatory Visit: Payer: Self-pay | Admitting: Family Medicine

## 2024-05-10 ENCOUNTER — Other Ambulatory Visit

## 2024-05-17 ENCOUNTER — Encounter: Admitting: Family Medicine
